# Patient Record
Sex: Female | Born: 1980 | Hispanic: Yes | Marital: Married | Smoking: Never smoker
Health system: Southern US, Community
[De-identification: ages and names within clinical notes are randomized; demographics above are authoritative.]

## PROBLEM LIST (undated history)

## (undated) ENCOUNTER — Inpatient Hospital Stay (HOSPITAL_COMMUNITY): Payer: Self-pay

## (undated) DIAGNOSIS — J189 Pneumonia, unspecified organism: Secondary | ICD-10-CM

## (undated) DIAGNOSIS — O24419 Gestational diabetes mellitus in pregnancy, unspecified control: Secondary | ICD-10-CM

## (undated) HISTORY — DX: Gestational diabetes mellitus in pregnancy, unspecified control: O24.419

## (undated) HISTORY — PX: LASIK: SHX215

---

## 2003-08-17 ENCOUNTER — Emergency Department (HOSPITAL_COMMUNITY): Admission: EM | Admit: 2003-08-17 | Discharge: 2003-08-18 | Payer: Self-pay | Admitting: Emergency Medicine

## 2003-08-21 ENCOUNTER — Encounter (INDEPENDENT_AMBULATORY_CARE_PROVIDER_SITE_OTHER): Payer: Self-pay | Admitting: Specialist

## 2003-08-21 ENCOUNTER — Inpatient Hospital Stay (HOSPITAL_COMMUNITY): Admission: AD | Admit: 2003-08-21 | Discharge: 2003-08-21 | Payer: Self-pay | Admitting: Family Medicine

## 2003-08-22 ENCOUNTER — Inpatient Hospital Stay (HOSPITAL_COMMUNITY): Admission: AD | Admit: 2003-08-22 | Discharge: 2003-08-22 | Payer: Self-pay | Admitting: Obstetrics & Gynecology

## 2003-08-29 ENCOUNTER — Inpatient Hospital Stay (HOSPITAL_COMMUNITY): Admission: AD | Admit: 2003-08-29 | Discharge: 2003-08-29 | Payer: Self-pay | Admitting: Obstetrics and Gynecology

## 2003-09-13 ENCOUNTER — Encounter: Admission: RE | Admit: 2003-09-13 | Discharge: 2003-09-13 | Payer: Self-pay | Admitting: Obstetrics and Gynecology

## 2004-11-07 ENCOUNTER — Inpatient Hospital Stay (HOSPITAL_COMMUNITY): Admission: AD | Admit: 2004-11-07 | Discharge: 2004-11-07 | Payer: Self-pay | Admitting: Obstetrics

## 2004-12-22 ENCOUNTER — Inpatient Hospital Stay (HOSPITAL_COMMUNITY): Admission: AD | Admit: 2004-12-22 | Discharge: 2004-12-22 | Payer: Self-pay | Admitting: Obstetrics

## 2004-12-24 ENCOUNTER — Encounter (INDEPENDENT_AMBULATORY_CARE_PROVIDER_SITE_OTHER): Payer: Self-pay | Admitting: *Deleted

## 2004-12-24 ENCOUNTER — Inpatient Hospital Stay (HOSPITAL_COMMUNITY): Admission: AD | Admit: 2004-12-24 | Discharge: 2004-12-27 | Payer: Self-pay | Admitting: Obstetrics

## 2009-03-24 ENCOUNTER — Encounter: Payer: Self-pay | Admitting: Emergency Medicine

## 2009-03-24 ENCOUNTER — Inpatient Hospital Stay (HOSPITAL_COMMUNITY): Admission: AD | Admit: 2009-03-24 | Discharge: 2009-03-25 | Payer: Self-pay | Admitting: Obstetrics

## 2009-05-17 ENCOUNTER — Inpatient Hospital Stay (HOSPITAL_COMMUNITY): Admission: AD | Admit: 2009-05-17 | Discharge: 2009-05-19 | Payer: Self-pay | Admitting: Obstetrics

## 2010-05-18 LAB — URINALYSIS, ROUTINE W REFLEX MICROSCOPIC
Bilirubin Urine: NEGATIVE
Hgb urine dipstick: NEGATIVE
Ketones, ur: NEGATIVE mg/dL
Nitrite: NEGATIVE
Protein, ur: NEGATIVE mg/dL
Specific Gravity, Urine: 1.017 (ref 1.005–1.030)
Urobilinogen, UA: 1 mg/dL (ref 0.0–1.0)

## 2010-05-18 LAB — BASIC METABOLIC PANEL
CO2: 19 mEq/L (ref 19–32)
Calcium: 7.8 mg/dL — ABNORMAL LOW (ref 8.4–10.5)
Chloride: 104 mEq/L (ref 96–112)
Creatinine, Ser: 0.51 mg/dL (ref 0.4–1.2)
Glucose, Bld: 100 mg/dL — ABNORMAL HIGH (ref 70–99)
Potassium: 3.5 mEq/L (ref 3.5–5.1)

## 2010-05-18 LAB — CBC
HCT: 31.4 % — ABNORMAL LOW (ref 36.0–46.0)
Hemoglobin: 10.9 g/dL — ABNORMAL LOW (ref 12.0–15.0)
RBC: 3.79 MIL/uL — ABNORMAL LOW (ref 3.87–5.11)
RDW: 14.9 % (ref 11.5–15.5)
WBC: 13.7 10*3/uL — ABNORMAL HIGH (ref 4.0–10.5)

## 2010-05-18 LAB — DIFFERENTIAL
Basophils Absolute: 0 10*3/uL (ref 0.0–0.1)
Basophils Relative: 0 % (ref 0–1)
Eosinophils Relative: 0 % (ref 0–5)
Monocytes Absolute: 1 10*3/uL (ref 0.1–1.0)
Monocytes Relative: 7 % (ref 3–12)

## 2010-05-18 LAB — URINE MICROSCOPIC-ADD ON

## 2010-05-25 LAB — CBC
MCHC: 32.3 g/dL (ref 30.0–36.0)
MCHC: 33 g/dL (ref 30.0–36.0)
Platelets: 175 10*3/uL (ref 150–400)
Platelets: 245 10*3/uL (ref 150–400)
RBC: 2.16 MIL/uL — ABNORMAL LOW (ref 3.87–5.11)
RDW: 17.3 % — ABNORMAL HIGH (ref 11.5–15.5)
WBC: 25.3 10*3/uL — ABNORMAL HIGH (ref 4.0–10.5)

## 2010-07-18 NOTE — H&P (Signed)
NAMEMarland Kitchen  TING, CAGE NO.:  [id number]   MEDICAL RECORD NO.:  [id number]          PATIENT TYPE:  INP   LOCATION:  9119                          FACILITY:  [facility]   PHYSICIAN:  Kathreen Cosier, M.D.DATE OF BIRTH:  1980-10-24   DATE OF ADMISSION:  12/24/2004  DATE OF DISCHARGE:                                HISTORY & PHYSICAL   HISTORY OF PRESENT ILLNESS:  The patient is a 30 year old gravida 2, para 1-  0-1-0, EDC December 29, 2004, negative GBS, admitted in labor at 4:30 a.m.  Cervix 4 cm, 90%, vertex -3, membranes intact.  At 9 a.m., she had an  epidural, and the membranes were ruptured artificially.  The fluid was  clear.  IUPC was inserted, and the cervix was 4 cm, 90%, vertex -3, regular  contractions.  She was started on low-dose Pitocin.  By 5 p.m., she was 6  cm, 100%, with the vertex to a -2 station; 10 mg of Pitocin, contracting  every 3 minutes.  She had an amnioinfusion of Ringers of lactate because of  some small variables that she had had.  The tracing at this time was  reasurring.  By 9 p.m., her cervix was unchanged, after her being __________  with labor.  Her temperature was 101.2.  It was decided that she would be  delivered by cesarean section for failure to progress in labor.   PHYSICAL EXAMINATION:  GENERAL:  A well-developed female in labor.  HEENT:  Negative.  LUNGS:  Clear.  HEART:  Regular rhythm.  No murmurs or gallops.  BREASTS:  No masses.  ABDOMEN:  Term size.  Estimated fetal weight 7 pounds.  PELVIC FINDINGS:  As above.  EXTREMITIES:  Negative.           ______________________________  Kathreen Cosier, M.D.     BAM/MEDQ  D:  12/24/2004  T:  12/24/2004  Job:  [id number]

## 2010-07-18 NOTE — Discharge Summary (Signed)
NAMEMarland Kitchen  HARTLEIGH, EDMONSTON NO.:  [id number]   MEDICAL RECORD NO.:  [id number]          PATIENT TYPE:  INP   LOCATION:  9119                          [facility]ACILITY[facility]  [facility]   PHYSICIAN:  Kathreen Cosier, M.D.DATE OF BIRTH:  Jul 05, 1980   DATE OF ADMISSION:  12/24/2004  DATE OF DISCHARGE:  12/27/2004                                 DISCHARGE SUMMARY   The patient is a 30 year old gravida 2, para 1-0-1-0, [facility] December 29, 2004.  Negative GBS.  She was admitted in labor and underwent primary low  transverse cesarean section.  She had a female, Apgar 9/9 weighing 6 pounds 15  from the OP position.  Patient had maternal temperature and failure to  progress as her indications for cesarean section.  Her hemoglobin was 11.4  on admission, 10 postoperative, platelets 254, 222.  RPR negative.  Patient  did well postoperative.  Her hemoglobin was 10.  She was discharged on the  third postoperative day, ambulatory, on a regular diet, to see me in six  weeks.   DISCHARGE DIAGNOSES:  Status post primary low transverse cesarean section at  term for maternal fever and failure to progress in labor.           ______________________________  Kathreen Cosier, M.D.     BAM/MEDQ  D:  01/28/2005  T:  01/28/2005  Job:  [id number]

## 2010-07-18 NOTE — Op Note (Signed)
NAMEREBBECCA, [name] NO.:  [id number]   MEDICAL RECORD NO.:  [id number]          PATIENT TYPE:  INP   LOCATION:  9119                          [facility]ILITY[facility]  [facility]   PHYSICIAN:  Kathreen Cosier, M.D.DATE OF BIRTH:  1980-05-26   DATE OF PROCEDURE:  12/24/2004  DATE OF DISCHARGE:                                 OPERATIVE REPORT   PREOPERATIVE DIAGNOSIS:  Failure to progress in labor.   SURGEON:  Kathreen Cosier, M.D.   ANESTHESIA:  Epidural.   DESCRIPTION OF PROCEDURE:  The patient was placed on the operating table in  the supine position.  Abdomen was prepped and draped.  Bladder emptied with  a Foley catheter.  Transverse suprapubic incision was made and carried down  to the rectus fascia.  The fascia was cleaned and incised the length of the  incision.  The rectus muscles were retracted laterally and the peritoneum  incised longitudinally.  A transverse incision was made in the  visceroperitoneum above the bladder and the bladder mobilized inferiorly.  A  transverse lower uterine incision was made and the fluid was clear.  The  patient delivered from the OP position of a female, Apgars 9 and 9, with a  loose nuchal cord.  The team was in attendance.  The placenta was posterior  and removed manually and sent to pathology.  The uterine cavity was cleaned  with dry laps.  The uterine incision was closed in one layer with continuous  suture of #1 chromic.  The bladder flap was reattached with 2-0 chromic.  The uterus was well contracted.  Tubes and ovaries normal.  Hemostasis  satisfactory.  Abdomen closed in layers.  The peritoneum was continuous  suture of 0 chromic, the fascia with continuous suture of 0 Dexon, and the  skin closed with subcuticular stitch of 4-0 Monocryl.           ______________________________  Kathreen Cosier, M.D.     BAM/MEDQ  D:  12/24/2004  T:  12/24/2004  Job:  [id number]

## 2011-03-03 HISTORY — PX: LASIK: SHX215

## 2014-11-01 LAB — PROCEDURE REPORT - SCANNED: Pap: NEGATIVE

## 2014-11-01 LAB — OB RESULTS CONSOLE HIV ANTIBODY (ROUTINE TESTING): HIV: NONREACTIVE

## 2014-11-01 LAB — OB RESULTS CONSOLE GC/CHLAMYDIA
CHLAMYDIA, DNA PROBE: NEGATIVE
Gonorrhea: NEGATIVE

## 2014-11-01 LAB — OB RESULTS CONSOLE HEPATITIS B SURFACE ANTIGEN: HEP B S AG: NEGATIVE

## 2014-11-01 LAB — OB RESULTS CONSOLE RUBELLA ANTIBODY, IGM: Rubella: NON-IMMUNE/NOT IMMUNE

## 2014-11-01 LAB — OB RESULTS CONSOLE ABO/RH: RH Type: POSITIVE

## 2014-11-01 LAB — OB RESULTS CONSOLE RPR: RPR: NONREACTIVE

## 2015-01-20 ENCOUNTER — Inpatient Hospital Stay (HOSPITAL_COMMUNITY)
Admission: AD | Admit: 2015-01-20 | Discharge: 2015-01-20 | Disposition: A | Discharge status: Home / Self Care | Payer: Self-pay | Source: Ambulatory Visit | Attending: Obstetrics | Admitting: Obstetrics

## 2015-01-20 ENCOUNTER — Encounter (HOSPITAL_COMMUNITY): Payer: Self-pay | Admitting: *Deleted

## 2015-01-20 DIAGNOSIS — O98812 Other maternal infectious and parasitic diseases complicating pregnancy, second trimester: Secondary | ICD-10-CM | POA: Insufficient documentation

## 2015-01-20 DIAGNOSIS — J069 Acute upper respiratory infection, unspecified: Secondary | ICD-10-CM | POA: Insufficient documentation

## 2015-01-20 DIAGNOSIS — Z3A22 22 weeks gestation of pregnancy: Secondary | ICD-10-CM | POA: Insufficient documentation

## 2015-01-20 HISTORY — DX: Pneumonia, unspecified organism: J18.9

## 2015-01-20 LAB — URINALYSIS, ROUTINE W REFLEX MICROSCOPIC
Bilirubin Urine: NEGATIVE
Glucose, UA: NEGATIVE mg/dL
Ketones, ur: NEGATIVE mg/dL
NITRITE: NEGATIVE
PROTEIN: NEGATIVE mg/dL
pH: 6 (ref 5.0–8.0)

## 2015-01-20 LAB — URINE MICROSCOPIC-ADD ON

## 2015-01-20 MED ORDER — DM-GUAIFENESIN ER 30-600 MG PO TB12: 1.0000 | ORAL_TABLET | Freq: Two times a day (BID) | ORAL | Status: DC

## 2015-01-20 MED ORDER — DM-GUAIFENESIN ER 30-600 MG PO TB12
1.0000 | ORAL_TABLET | Freq: Two times a day (BID) | ORAL | Status: DC
  Administered 2015-01-20: 1 via ORAL
  Filled 2015-01-20 (×3): qty 1

## 2015-01-20 MED ORDER — ACETAMINOPHEN-CODEINE #3 300-30 MG PO TABS: 1.0000 | ORAL_TABLET | Freq: Four times a day (QID) | ORAL | Status: DC | PRN

## 2015-01-20 NOTE — MAU Note (Addendum)
C/O generalized aching for 4 days. Started coughing 3 days ago. Thinks she had fever 2 days ago. Since then has mainly been cough. Unable to sleep.

## 2015-01-20 NOTE — Discharge Instructions (Signed)
Tos en los adultos (Cough, Adult) La tos ayuda a limpiar la garganta y los pulmones. La tos puede durar solo 2 o 3semanas (aguda) o ms de 8semanas (crnica). Las causas de la tos son [place]. Puede ser el signo de [place] enfermedad o de otro trastorno. CUIDADOS EN EL HOGAR  Est atento a cualquier cambio en la tos.  Tome los medicamentos solamente como se lo haya indicado el mdico.  Si le recetaron un antibitico, tmelo como se lo haya indicado el mdico. No deje de tomarlo aunque comience a sentirse mejor.  Hable con el mdico antes de probar un medicamento para la tos.  Beba suficiente lquido para mantener el pis (orina) claro o de color amarillo plido.  Si el aire est seco, use un vaporizador o un humidificador con vapor fro en su casa.  Mantngase alejado de las cosas que lo hacen toser en el trabajo o en su casa.  Si la tos aumenta durante la noche, haga la prueba de usar almohadas adicionales para Pharmacologistmantener la cabeza ms elevada mientras duerme.  No fume e intente no estar cerca de humo. Si necesita ayuda para dejar de fumar, consulte al mdico.  No consuma cafena.  No beba alcohol.  Descanse todo lo que sea necesario. SOLICITE AYUDA SI:  Le aparecen problemas (sntomas) nuevos.  Expectora un lquido amarillento (pus) cuando tose.  La tos no mejora despus de 2 o 3semanas, o empeora.  Los medicamentos no Lowe's Companiesle alivian la tos, y no Stage managerpuede dormir bien.  Siente un dolor que se vuelve ms intenso o que no se BJ'salivia con los medicamentos.  Tiene fiebre.  Est bajando de peso y no sabe por qu.  Tiene transpiracin nocturna. SOLICITE AYUDA DE INMEDIATO SI:  Tose y escupe sangre.  Tiene dificultad para respirar.  Los latidos cardacos son muy rpidos.   Esta informacin no tiene Theme park managercomo fin reemplazar el consejo del mdico. Asegrese de hacerle al mdico cualquier pregunta que tenga.   Document Released: 10/30/2010 Document Revised: 11/07/2014 Elsevier Interactive  Patient Education 2016 [employer] Safe Medications in Pregnancy   Acne: Benzoyl Peroxide Salicylic Acid  Backache/Headache: Tylenol: 2 regular strength every 4 hours OR              2 Extra strength every 6 hours  Colds/Coughs/Allergies: Benadryl (alcohol free) 25 mg every 6 hours as needed Breath right strips Claritin Cepacol throat lozenges Chloraseptic throat spray Cold-Eeze- up to three times per day Cough drops, alcohol free Flonase (by prescription only) Guaifenesin Mucinex Robitussin DM (plain only, alcohol free) Saline nasal spray/drops Sudafed (pseudoephedrine) & Actifed ** use only after [redacted] weeks gestation and if you do not have high blood pressure Tylenol Vicks Vaporub Zinc lozenges Zyrtec   Constipation: Colace Ducolax suppositories Fleet enema Glycerin suppositories Metamucil Milk of magnesia Miralax Senokot Smooth move tea  Diarrhea: Kaopectate Imodium A-D  *NO pepto Bismol  Hemorrhoids: Anusol Anusol HC Preparation H Tucks  Indigestion: Tums Maalox Mylanta Zantac  Pepcid  Insomnia: Benadryl (alcohol free) 25mg  every 6 hours as needed Tylenol PM Unisom, no Gelcaps  Leg Cramps: Tums MagGel  Nausea/Vomiting:  Bonine Dramamine Emetrol Ginger extract Sea bands Meclizine  Nausea medication to take during pregnancy:  Unisom (doxylamine succinate 25 mg tablets) Take one tablet daily at bedtime. If symptoms are not adequately controlled, the dose can be increased to a maximum recommended dose of two tablets daily (1/2 tablet in the morning, 1/2 tablet mid-afternoon and one at bedtime). Vitamin B6 100mg  tablets. Take  one tablet twice a day (up to 200 mg per day).  Skin Rashes: Aveeno products Benadryl cream or  every 6 hours as needed Calamine Lotion 1% cortisone cream  Yeast infection: Gyne-lotrimin 7 Monistat 7   **If taking multiple medications, please check labels to avoid duplicating the same active  ingredients **take medication as directed on the label ** Do not exceed 4000 mg of tylenol in 24 hours **Do not take medications that contain aspirin or ibuprofen

## 2015-01-20 NOTE — MAU Provider Note (Signed)
  History     CSN: [id number]  Arrival date and time: 01/20/15 1241   First Provider Initiated Contact with Patient 01/20/15 1331      Chief Complaint  Patient presents with  . Cough   HPIpt is [id number], pt of Dr. Elsie StainMarshall's, who presents for evaluation of Cough for 4 days- Pt has hx of pneumonia - pt said when she had pneumonia and had chest pain, but denies chest pain at this time- her discomfort is around her throat when she coughs. Pt denies sinus congestion, but has bilateral temporal headache.  Pt has dry cough. Pt has not taken anything for her cough. Pt denies abdominal pain, cramping, bleeding or LOF Pt has not been around other sick people. Pt denies complications with her pregnancy Pt denies fever but has had some chills  Past Medical History  Diagnosis Date  . Pneumonia     Past Surgical History  Procedure Laterality Date  . Cesarean section    . Lasik      History reviewed. No pertinent family history.  Social History  Substance Use Topics  . Smoking status: Never Smoker   . Smokeless tobacco: Never Used  . Alcohol Use: No    Allergies: No Known Allergies  Prescriptions prior to admission  Medication Sig Dispense Refill Last Dose  . Prenatal Vit-Fe Fumarate-FA (PRENATAL MULTIVITAMIN) TABS tablet Take 1 tablet by mouth daily at 12 noon.   01/19/2015 at Unknown time    Review of Systems  Constitutional: Positive for chills. Negative for fever.  Respiratory: Positive for cough. Negative for sputum production.   Gastrointestinal: Negative for heartburn, nausea, vomiting, abdominal pain, diarrhea and constipation.  Genitourinary: Negative for dysuria and urgency.  Neurological: Positive for headaches.   Physical Exam   Blood pressure 101/59, pulse 102, temperature 98.3 F (36.8 C), temperature source Oral, resp. rate 18, SpO2 98 %.  Physical Exam  Nursing note and vitals reviewed. Constitutional: She is oriented to person, place, and time.  She appears well-developed and well-nourished. No distress.  HENT:  Head: Normocephalic.  Mouth/Throat: Oropharynx is clear and moist. No oropharyngeal exudate.  No edema noted- appears slightly irritated  Eyes: Pupils are equal, round, and reactive to light.  Neck: Normal range of motion.  Cardiovascular: Normal rate, regular rhythm and normal heart sounds.   Respiratory: Effort normal and breath sounds normal. No respiratory distress. She has no wheezes. She has no rales. She exhibits no tenderness.  GI: Soft.  Musculoskeletal: Normal range of motion.  Neurological: She is alert and oriented to person, place, and time.  Skin: Skin is warm and dry.  Psychiatric: She has a normal mood and affect.    MAU Course  Procedures Mucinex DM given in MAU for cough FHR 148 with doppler Assessment and Plan  URI/cough- Rx Mucinex DM every 12 hours Tylenol #3 if needed for cough Pregnancy 2nd trimester- f/u with Dr. Gaynell FaceMArshall as scheduled- sooner if sx persist or worsen  Abir Craine 01/20/2015, 1:31 PM

## 2015-01-25 ENCOUNTER — Inpatient Hospital Stay (HOSPITAL_COMMUNITY): Payer: Medicaid Other

## 2015-01-25 ENCOUNTER — Encounter (HOSPITAL_COMMUNITY): Payer: Self-pay | Admitting: *Deleted

## 2015-01-25 ENCOUNTER — Inpatient Hospital Stay (HOSPITAL_COMMUNITY)
Admission: AD | Admit: 2015-01-25 | Discharge: 2015-01-29 | DRG: 781 | Disposition: A | Discharge status: Home / Self Care | Payer: Medicaid Other | Source: Ambulatory Visit | Attending: Obstetrics | Admitting: Obstetrics

## 2015-01-25 DIAGNOSIS — R05 Cough: Secondary | ICD-10-CM

## 2015-01-25 DIAGNOSIS — R053 Chronic cough: Secondary | ICD-10-CM

## 2015-01-25 DIAGNOSIS — Z3A01 Less than 8 weeks gestation of pregnancy: Secondary | ICD-10-CM | POA: Diagnosis not present

## 2015-01-25 DIAGNOSIS — O99512 Diseases of the respiratory system complicating pregnancy, second trimester: Principal | ICD-10-CM | POA: Diagnosis present

## 2015-01-25 DIAGNOSIS — O26892 Other specified pregnancy related conditions, second trimester: Secondary | ICD-10-CM | POA: Diagnosis not present

## 2015-01-25 DIAGNOSIS — J690 Pneumonitis due to inhalation of food and vomit: Secondary | ICD-10-CM | POA: Diagnosis present

## 2015-01-25 DIAGNOSIS — J189 Pneumonia, unspecified organism: Secondary | ICD-10-CM | POA: Diagnosis present

## 2015-01-25 LAB — CBC WITH DIFFERENTIAL/PLATELET
Basophils Absolute: 0 10*3/uL (ref 0.0–0.1)
Basophils Relative: 0 %
Eosinophils Absolute: 0.1 10*3/uL (ref 0.0–0.7)
Eosinophils Relative: 1 %
HCT: 35 % — ABNORMAL LOW (ref 36.0–46.0)
HEMOGLOBIN: 11.8 g/dL — AB (ref 12.0–15.0)
LYMPHS ABS: 1.5 10*3/uL (ref 0.7–4.0)
LYMPHS PCT: 13 %
MCH: 31.1 pg (ref 26.0–34.0)
MCHC: 33.7 g/dL (ref 30.0–36.0)
MCV: 92.1 fL (ref 78.0–100.0)
MONOS PCT: 6 %
Monocytes Absolute: 0.7 10*3/uL (ref 0.1–1.0)
NEUTROS ABS: 9.1 10*3/uL — AB (ref 1.7–7.7)
NEUTROS PCT: 80 %
PLATELETS: 235 10*3/uL (ref 150–400)
RBC: 3.8 MIL/uL — AB (ref 3.87–5.11)
RDW: 14 % (ref 11.5–15.5)
WBC: 11.4 10*3/uL — AB (ref 4.0–10.5)

## 2015-01-25 LAB — URINALYSIS, ROUTINE W REFLEX MICROSCOPIC
BILIRUBIN URINE: NEGATIVE
Glucose, UA: NEGATIVE mg/dL
Ketones, ur: NEGATIVE mg/dL
LEUKOCYTES UA: NEGATIVE
NITRITE: NEGATIVE
PH: 7 (ref 5.0–8.0)
Protein, ur: NEGATIVE mg/dL
SPECIFIC GRAVITY, URINE: 1.01 (ref 1.005–1.030)

## 2015-01-25 LAB — URINE MICROSCOPIC-ADD ON

## 2015-01-25 LAB — ABO/RH: ABO/RH(D): A POS

## 2015-01-25 LAB — TYPE AND SCREEN
ABO/RH(D): A POS
Antibody Screen: NEGATIVE

## 2015-01-25 MED ORDER — PRENATAL MULTIVITAMIN CH
1.0000 | ORAL_TABLET | Freq: Every day | ORAL | Status: DC
  Administered 2015-01-25 – 2015-01-28 (×4): 1 via ORAL
  Filled 2015-01-25 (×4): qty 1

## 2015-01-25 MED ORDER — ACETAMINOPHEN 325 MG PO TABS: 650.0000 mg | ORAL_TABLET | ORAL | Status: DC | PRN

## 2015-01-25 MED ORDER — SODIUM CHLORIDE 0.9 % IV SOLN
INTRAVENOUS | Status: DC
Start: 2015-01-25 — End: 2015-01-29
  Administered 2015-01-25 – 2015-01-28 (×10): via INTRAVENOUS

## 2015-01-25 MED ORDER — DEXTROSE 5 % IV SOLN
2.0000 g | INTRAVENOUS | Status: DC
  Administered 2015-01-25 – 2015-01-28 (×4): 2 g via INTRAVENOUS
  Filled 2015-01-25 (×5): qty 2

## 2015-01-25 MED ORDER — DOCUSATE SODIUM 100 MG PO CAPS
100.0000 mg | ORAL_CAPSULE | Freq: Every day | ORAL | Status: DC
  Administered 2015-01-26 – 2015-01-28 (×3): 100 mg via ORAL
  Filled 2015-01-25 (×3): qty 1

## 2015-01-25 MED ORDER — CALCIUM CARBONATE ANTACID 500 MG PO CHEW: 2.0000 | CHEWABLE_TABLET | ORAL | Status: DC | PRN

## 2015-01-25 MED ORDER — SODIUM CHLORIDE 0.9 % IV BOLUS (SEPSIS)
1000.0000 mL | Freq: Once | INTRAVENOUS | Status: AC
  Administered 2015-01-25: 1000 mL via INTRAVENOUS

## 2015-01-25 MED ORDER — ZOLPIDEM TARTRATE 5 MG PO TABS: 5.0000 mg | ORAL_TABLET | Freq: Every evening | ORAL | Status: DC | PRN

## 2015-01-25 MED ORDER — DEXTROSE 5 % IV SOLN
500.0000 mg | INTRAVENOUS | Status: DC
  Administered 2015-01-25: 500 mg via INTRAVENOUS
  Filled 2015-01-25: qty 500

## 2015-01-25 MED ORDER — BENZONATATE 100 MG PO CAPS
200.0000 mg | ORAL_CAPSULE | Freq: Once | ORAL | Status: AC
  Administered 2015-01-25: 200 mg via ORAL
  Filled 2015-01-25: qty 2

## 2015-01-25 NOTE — MAU Provider Note (Signed)
History     CSN: [id number]  Arrival date and time: 01/25/15 1478   First Provider Initiated Contact with Patient 01/25/15 1721      No chief complaint on file.  HPI Annette [name] 34 y.o. [id number]  presents for cough.  She is now having yellow-green phlegm.  She has shortness of breath, chills, sweats.   Last week she saw Annette [name] and was expecting to improve, but has worsened.  Mucinex,DM and Tylenol with codeine not helpful.  She states no sick contacts.   Baby is moving well.  Denies LOF, VB, dsyuria.   OB History    Gravida Para Term Preterm AB TAB SAB Ectopic Multiple Living   Past Medical History  Diagnosis Date  . Pneumonia     Past Surgical History  Procedure Laterality Date  . Cesarean section    . Lasik      History reviewed. No pertinent family history.  Social History  Substance Use Topics  . Smoking status: Never Smoker   . Smokeless tobacco: Never Used  . Alcohol Use: No    Allergies: No Known Allergies  Prescriptions prior to admission  Medication Sig Dispense Refill Last Dose  . acetaminophen-codeine (TYLENOL #3) 300-30 MG tablet Take 1-2 tablets by mouth every 6 (six) hours as needed for moderate pain. 15 tablet 0 01/24/2015 at Unknown time  . dextromethorphan-guaiFENesin (MUCINEX DM) 30-600 MG 12hr tablet Take 1 tablet by mouth 2 (two) times daily. 20 tablet 0 01/24/2015 at Unknown time  . Prenatal Vit-Fe Fumarate-FA (PRENATAL MULTIVITAMIN) TABS tablet Take 1 tablet by mouth at bedtime.    01/24/2015 at Unknown time    ROS Pertinent ROS in HPI.  All other systems are negative.   Physical Exam   Blood pressure 103/55, pulse 90, temperature 97.8 F (36.6 C), resp. rate 18, SpO2 93 %.  Physical Exam  BP 96/61 mmHg  Pulse 84  Temp(Src) 98.4 F (36.9 C) (Oral)  Resp 18  Ht 5' (1.524 m)  Wt 142 lb (64.411 kg)  BMI 27.73 kg/m2  SpO2 92%  Constitutional: Well developed, well nourished female in NAD.    Cardiovascular: Regular rate and rhythm Respiratory: clear to auscultation in upper lobes, lower lobes bilaterally with diminished lung sounds.  No crackles or consolidation appreciated Skin: Warm, dry Neuro: CN II-XII grossly intact Psych: Normal mood, normal behavior   MAU Course  Procedures  MDM Oxygen saturation at 92%. Oxygen started via nasal canula.  Oxygen sat improved to 94-96%.  CBC and CXR ordered to eval.  Results for orders placed or performed during the hospital encounter of 01/25/15 (from the past 24 hour(s))  CBC with Differential/Platelet     Status: Abnormal   Collection Time: 01/25/15  5:28 PM  Result Value Ref Range   WBC 11.4 (H) 4.0 - 10.5 K/uL   RBC 3.80 (L) 3.87 - 5.11 MIL/uL   Hemoglobin 11.8 (L) 12.0 - 15.0 g/dL   HCT 08.6 (L) 57.8 - 46.9 %   MCV 92.1 78.0 - 100.0 fL   MCH 31.1 26.0 - 34.0 pg   MCHC 33.7 30.0 - 36.0 g/dL   RDW 62.9 52.8 - 41.3 %   Platelets 235 150 - 400 K/uL   Neutrophils Relative % 80 %   Neutro Abs 9.1 (H) 1.7 - 7.7 K/uL   Lymphocytes Relative 13 %   Lymphs Abs 1.5 0.7 - 4.0 K/uL  Monocytes Relative 6 %   Monocytes Absolute 0.7 0.1 - 1.0 K/uL   Eosinophils Relative 1 %   Eosinophils Absolute 0.1 0.0 - 0.7 K/uL   Basophils Relative 0 %   Basophils Absolute 0.0 0.0 - 0.1 K/uL  Urinalysis, Routine w reflex microscopic (not [facility])     Status: Abnormal   Collection Time: 01/25/15  5:33 PM  Result Value Ref Range   Color, Urine YELLOW YELLOW   APPearance CLOUDY (A) CLEAR   Specific Gravity, Urine 1.010 1.005 - 1.030   pH 7.0 5.0 - 8.0   Glucose, UA NEGATIVE NEGATIVE mg/dL   Hgb urine dipstick TRACE (A) NEGATIVE   Bilirubin Urine NEGATIVE NEGATIVE   Ketones, ur NEGATIVE NEGATIVE mg/dL   Protein, ur NEGATIVE NEGATIVE mg/dL   Nitrite NEGATIVE NEGATIVE   Leukocytes, UA NEGATIVE NEGATIVE  Urine microscopic-add on     Status: Abnormal   Collection Time: 01/25/15  5:33 PM  Result Value Ref Range   Squamous Epithelial / LPF  6-30 (A) NONE SEEN   WBC, UA 0-5 0 - 5 WBC/hpf   RBC / HPF 0-5 0 - 5 RBC/hpf   Bacteria, UA RARE (A) NONE SEEN  Type and screen [facility]     Status: None   Collection Time: 01/25/15  7:25 PM  Result Value Ref Range   ABO/RH(D) A POS    Antibody Screen NEG    Sample Expiration 01/28/2015    Dg Chest 2 View  01/25/2015  CLINICAL DATA:  Cough since Tuesday. Shortness of breath. Left-sided chest pain radiating towards the back. Low oxygen saturation. Twenty-three weeks pregnant. Patient was shielded. EXAM: CHEST  2 VIEW COMPARISON:  03/24/2009 FINDINGS: Normal heart size and pulmonary vascularity. Slight nodular infiltration in the lung bases may indicate pneumonia or mild edema. No blunting of costophrenic angles. No pneumothorax. Mediastinal contours appear intact. IMPRESSION: Slight infiltration in the lung bases may indicate early pneumonia or edema. Electronically Signed   By: Burman NievesWilliam  [name] M.D.   On: 01/25/2015 18:23    Results and oxygen saturation presented to Dr. Clearance CootsHarper.  He is agreeable for admission of patient for IV antibiotics and oxygen therapy.   IV fluids and antibiotics ordered  Assessment and Plan  A: Admit to antepartum  Annette [name] 01/25/2015, 5:23 PM

## 2015-01-25 NOTE — MAU Note (Signed)
Pt states that she has had a cough for weeks, was evaluated on 01/20/15 and given medications and it is not helping. Cough is worse and now she has a sore throat.

## 2015-01-25 NOTE — MAU Note (Signed)
Pt presents to MAU with complaints of a cough since Tuesday.

## 2015-01-25 NOTE — H&P (Signed)
Annette [name] is a 34 y.o. female presenting for chest pain and SOB. Maternal Medical History:  Reason for admission: Presents at 23 weeks with chest pain and SOB.  Prenatal complications: no prenatal complications Prenatal Complications - Diabetes: none.    OB History    Gravida Para Term Preterm AB TAB SAB Ectopic Multiple Living   Past Medical History  Diagnosis Date  . Pneumonia    Past Surgical History  Procedure Laterality Date  . Cesarean section    . Lasik     Family History: family history is not on file. Social History:  reports that she has never smoked. She has never used smokeless tobacco. She reports that she does not drink alcohol or use illicit drugs.   Prenatal Transfer Tool  Maternal Diabetes: No Genetic Screening: Declined Maternal Ultrasounds/Referrals: Normal Fetal Ultrasounds or other Referrals:  None Maternal Substance Abuse:  No Significant Maternal Medications:  None Significant Maternal Lab Results:  None Other Comments:  None  Review of Systems  Respiratory: Positive for shortness of breath.   Cardiovascular: Positive for chest pain.  All other systems reviewed and are negative.     Blood pressure 96/61, pulse 84, temperature 98.4 F (36.9 C), temperature source Oral, resp. rate 18, height 5' (1.524 m), weight 142 lb (64.411 kg), SpO2 92 %. Maternal Exam:  Abdomen: Patient reports no abdominal tenderness.   Physical Exam  Nursing note and vitals reviewed. Constitutional: She is oriented to person, place, and time. She appears well-developed and well-nourished.  HENT:  Head: Normocephalic and atraumatic.  Eyes: Conjunctivae are normal. Pupils are equal, round, and reactive to light.  Neck: Normal range of motion. Neck supple.  Cardiovascular: Normal rate and regular rhythm.   Respiratory: Effort normal and breath sounds normal.  GI: Soft. Bowel sounds are normal.  Musculoskeletal: Normal range of motion.   Neurological: She is alert and oriented to person, place, and time.  Skin: Skin is warm and dry.  Psychiatric: She has a normal mood and affect. Her behavior is normal. Judgment and thought content normal.     Study Result     CLINICAL DATA: Cough since Tuesday. Shortness of breath. Left-sided chest pain radiating towards the back. Low oxygen saturation. Twenty-three weeks pregnant. Patient was shielded.  EXAM: CHEST 2 VIEW  COMPARISON: 03/24/2009  FINDINGS: Normal heart size and pulmonary vascularity. Slight nodular infiltration in the lung bases may indicate pneumonia or mild edema. No blunting of costophrenic angles. No pneumothorax. Mediastinal contours appear intact.  IMPRESSION: Slight infiltration in the lung bases may indicate early pneumonia or edema.   Electronically Signed  By: Burman Nieves M.D.  On: 01/25/2015 18:23        Results for AMIL, MOSEMAN (MRN [id number]) as of 01/25/2015 21:43  Ref. Range 01/25/2015 17:28  WBC Latest Ref Range: 4.0-10.5 K/uL 11.4 (H)  RBC Latest Ref Range: 3.87-5.11 MIL/uL 3.80 (L)  Hemoglobin Latest Ref Range: 12.0-15.0 g/dL 40.9 (L)  HCT Latest Ref Range: 36.0-46.0 % 35.0 (L)  MCV Latest Ref Range: 78.0-100.0 fL 92.1  MCH Latest Ref Range: 26.0-34.0 pg 31.1  MCHC Latest Ref Range: 30.0-36.0 g/dL 81.1  RDW Latest Ref Range: 11.5-15.5 % 14.0  Platelets Latest Ref Range: 150-400 K/uL 235  Neutrophils Latest Units: % 80  Lymphocytes Latest Units: % 13  Monocytes Relative Latest Units: % 6  Eosinophil Latest Units: % 1  Basophil Latest Units: % 0  NEUT# Latest Ref Range: 1.7-7.7 K/uL 9.1 (H)  Lymphocyte # Latest Ref Range: 0.7-4.0 K/uL 1.5  Monocyte # Latest Ref Range: 0.1-1.0 K/uL 0.7  Eosinophils Absolute Latest Ref Range: 0.0-0.7 K/uL 0.1  Basophils Absolute Latest Ref Range: 0.0-0.1 K/uL 0.0   Prenatal labs: ABO, Rh: --/--/A POS (11/25 1925) Antibody: NEG (11/25 1925) Rubella:   RPR:     HBsAg:    HIV:    GBS:     Assessment/Plan: 23 weeks.  Early pneumonia.  Admit.  IV antibiotics.   Kaydi Kley A 01/25/2015, 9:36 PM

## 2015-01-25 NOTE — MAU Note (Signed)
[facility]. Pt may go to [phone]

## 2015-01-26 MED ORDER — AZITHROMYCIN 250 MG PO TABS
500.0000 mg | ORAL_TABLET | ORAL | Status: DC
  Administered 2015-01-26 – 2015-01-28 (×3): 500 mg via ORAL
  Filled 2015-01-26 (×3): qty 2

## 2015-01-26 MED ORDER — INFLUENZA VAC SPLIT QUAD 0.5 ML IM SUSY: 0.5000 mL | PREFILLED_SYRINGE | INTRAMUSCULAR | Status: DC

## 2015-01-26 NOTE — Consult Note (Signed)
[facility] Medical Consultation  Annette FiddlerYolanda [name] HKV:[id number] DOB: 07/03/1980 DOA: 01/25/2015 PCP: No primary care provider on file.   Requesting physician: Dr. Coral Ceoharles Harper Date of consultation: 01/26/15 Reason for consultation: CAP  Impression/Recommendations Active Problems:   Pneumonia affecting pregnancy in second trimester - confirmed on x ray - Agree with Azithromycin and Ceftriaxone for CAP - Once leukocytosis resolved and breathing condition improved may consider transitioning to oral antibiotic regimen   Chief Complaint: Cough, DOE  HPI:  34 y/o [redacted] wks pregnant presenting with cough, sob, and DOE. She denies any hemoptysis or recent travel outside of the country. States that for the last 3 days her symptoms started and have progressively got worse. The problem has been persistent since onset as such she presented for further evaluation and recommendations. While in the hospital she had a chest x ray which was positive or suspicious for PNA.  Review of Systems:  12 point review of system reviewed and negative unless otherwise mentioned above.  Past Medical History  Diagnosis Date  . Pneumonia    Past Surgical History  Procedure Laterality Date  . Cesarean section    . Lasik     Social History:  reports that she has never smoked. She has never used smokeless tobacco. She reports that she does not drink alcohol or use illicit drugs.  No Known Allergies History reviewed. No pertinent family history.  Prior to Admission medications   Medication Sig Start Date End Date Taking? Authorizing Provider  acetaminophen-codeine (TYLENOL #3) 300-30 MG tablet Take 1-2 tablets by mouth every 6 (six) hours as needed for moderate pain. 01/20/15  Yes Jean RosenthalSusan P Lineberry, NP  dextromethorphan-guaiFENesin (MUCINEX DM) 30-600 MG 12hr tablet Take 1 tablet by mouth 2 (two) times daily. 01/20/15  Yes Jean RosenthalSusan P Lineberry, NP  Prenatal Vit-Fe Fumarate-FA (PRENATAL MULTIVITAMIN)  TABS tablet Take 1 tablet by mouth at bedtime.    Yes Historical Provider, MD   Physical Exam: Blood pressure 93/60, pulse 84, temperature 98.2 F (36.8 C), temperature source Oral, resp. rate 18, height 5' (1.524 m), weight 64.411 kg (142 lb), SpO2 93 %. Filed Vitals:   01/26/15 1553 01/26/15 1557  BP:    Pulse: 79 84  Temp:    Resp:       General:  Pt in nad, alert and awake  Eyes: EOMI  ENT: no masses on visual examination  Neck: supple, no goiter  Cardiovascular: rrr, no mrg  Respiratory: equal chest rise, breahting comfortably with [place] in place, no wheezes, mild rhales at bases  Abdomen: soft, no guarding  Skin: no cyanosis  Musculoskeletal: equal tone BL  Psychiatric: mood and affect appropriate  Neurologic: answers questions appropriately, moves extremities equally.  Labs on Admission:  Basic Metabolic Panel: No results for input(s): NA, K, CL, CO2, GLUCOSE, BUN, CREATININE, CALCIUM, MG, PHOS in the last 168 hours. Liver Function Tests: No results for input(s): AST, ALT, ALKPHOS, BILITOT, PROT, ALBUMIN in the last 168 hours. No results for input(s): LIPASE, AMYLASE in the last 168 hours. No results for input(s): AMMONIA in the last 168 hours. CBC:  Recent Labs Lab 01/25/15 1728  WBC 11.4*  NEUTROABS 9.1*  HGB 11.8*  HCT 35.0*  MCV 92.1  PLT 235   Cardiac Enzymes: No results for input(s): CKTOTAL, CKMB, CKMBINDEX, TROPONINI in the last 168 hours. BNP: Invalid input(s): POCBNP CBG: No results for input(s): GLUCAP in the last 168 hours.  Radiological Exams on Admission: Dg Chest 2 View  01/25/2015  CLINICAL  DATA:  Cough since Tuesday. Shortness of breath. Left-sided chest pain radiating towards the back. Low oxygen saturation. Twenty-three weeks pregnant. Patient was shielded. EXAM: CHEST  2 VIEW COMPARISON:  03/24/2009 FINDINGS: Normal heart size and pulmonary vascularity. Slight nodular infiltration in the lung bases may indicate pneumonia or mild  edema. No blunting of costophrenic angles. No pneumothorax. Mediastinal contours appear intact. IMPRESSION: Slight infiltration in the lung bases may indicate early pneumonia or edema. Electronically Signed   By: Burman Nieves M.D.   On: 01/25/2015 18:23    Time spent: > 35 minutes  Penny Pia [facility] Pager [phone]  If 7PM-7AM, please contact night-coverage [email or web address] Password [email or web address] 01/26/2015, 4:02 PM

## 2015-01-26 NOTE — Progress Notes (Signed)
Patient ID: Melina FiddlerYolanda [name], female   DOB: 07/30/1980, 34 y.o.   MRN: [id number] Hospital Day: 2  S: Breathing without labor.  O: Blood pressure 89/52, pulse 81, temperature 98.3 F (36.8 C), temperature source Oral, resp. rate 22, height 5' (1.524 m), weight 142 lb (64.411 kg), SpO2 93 %.   WUJ:WJXBJYNWFHT:Baseline: 140's bpm Toco: None SVE:   A/P- 34 y.o. admitted with: Chest pain and SOB.  CXR showed lower lobe pneumonia.  Admitted for antibiotic therapy and respiratory management.  Stable.  Continue Rocephin / Azithromycin.  Present on Admission:  . Pneumonia affecting pregnancy in second trimester  Pregnancy Complications: none  Preterm labor management: no treatment necessary Dating:  7619w3d PNL Needed:  none FWB:  good PTL:  stable

## 2015-01-26 NOTE — Progress Notes (Signed)
While checking orders for the night found an order for "R.T. Consult to Respiratory Care treatment". RT had not been called for this order. Called antenatal to speak with nurse.  Johna RolesJ. Barnes, RN and I discussed patient to find out what the order was for. At this time patient is sleeping and RT vitals within normal limits. Arranged to consult with patient at 8am by RT.

## 2015-01-26 NOTE — Progress Notes (Signed)
Respiratory Care consult for pt. with pneumonia.  I assessed this patient with Spanish speaking interpreter at bedside. Pt. does not smoke. Pt. is able to take deep breaths. Currently on 4LPM O2 sats 94-96 ,RR 18.  Pt. does not appear to have labored breathing. CXR reviewed.  BBS equal with rales to bases bilat, no wheezing noted. I would recommend Incentive Spirometry Q1 hour while awake and Q4prn 2.5mg  Albuterol HHN for wheezing. I discussed recommendations with pt.s RN.

## 2015-01-27 DIAGNOSIS — O99512 Diseases of the respiratory system complicating pregnancy, second trimester: Principal | ICD-10-CM

## 2015-01-27 DIAGNOSIS — J189 Pneumonia, unspecified organism: Secondary | ICD-10-CM

## 2015-01-27 LAB — CBC
HEMATOCRIT: 34.4 % — AB (ref 36.0–46.0)
Hemoglobin: 11.3 g/dL — ABNORMAL LOW (ref 12.0–15.0)
MCH: 30.7 pg (ref 26.0–34.0)
MCHC: 32.8 g/dL (ref 30.0–36.0)
MCV: 93.5 fL (ref 78.0–100.0)
Platelets: 245 10*3/uL (ref 150–400)
RBC: 3.68 MIL/uL — ABNORMAL LOW (ref 3.87–5.11)
RDW: 14.4 % (ref 11.5–15.5)
WBC: 10.1 10*3/uL (ref 4.0–10.5)

## 2015-01-27 NOTE — Progress Notes (Signed)
Assisted Laboratory with interpretation of procedure with baby patient.  Spanish Interprerter

## 2015-01-27 NOTE — Progress Notes (Signed)
Assisted RN and Lab with interpretation of procedure.  Spanish interpreter

## 2015-01-27 NOTE — Progress Notes (Signed)
Checked on patients needs. Assisted RN with interpretation of patient assessment. °Spanish Interpreter  °

## 2015-01-27 NOTE — Progress Notes (Addendum)
No fevers, normal WBC count On discharge she may continue abx for total of 7 days, azithromycin and rocpehin in hospital and on discharge Augmentin (category B)  for left over amount.  Najah Liverman [facility] PasseypitalRH  [phone]

## 2015-01-27 NOTE — Progress Notes (Signed)
Patient ID: Annette [name], female   DOB: 05/05/1980, 34 y.o.   MRN: [id number] Hospital Day: 3  S: No complaints.  O: Blood pressure 85/48, pulse 86, temperature 98.5 F (36.9 C), temperature source Oral, resp. rate 20, height 5' (1.524 m), weight 142 lb (64.411 kg), SpO2 96 %.   ZHY:QMVHQIONFHT:Baseline: 140 bpm Toco: None SVE:   A/P- 34 y.o. admitted with:  Labored breathing and dyspnea on exertion.  CXR c/w early pneumonia.  Admitted and started on antibiotics and respiratory support.  Stable.  Continue Azithromycin / Rocephin.  Present on Admission:  . Pneumonia affecting pregnancy in second trimester  Pregnancy Complications: Pnuemonia  Preterm labor management: no treatment necessary Dating:  2920w4d PNL Needed:  none FWB:  good PTL:  none

## 2015-01-27 NOTE — Progress Notes (Signed)
Checked on patients needs.  °Spanish Interpreter  °

## 2015-01-27 NOTE — Progress Notes (Signed)
Checked on patients needs and ordered patients dinner.  Spanish Interpreter

## 2015-01-28 ENCOUNTER — Inpatient Hospital Stay (HOSPITAL_COMMUNITY): Payer: Medicaid Other

## 2015-01-28 LAB — CBC
HEMATOCRIT: 34.7 % — AB (ref 36.0–46.0)
HEMOGLOBIN: 11.5 g/dL — AB (ref 12.0–15.0)
MCH: 31.7 pg (ref 26.0–34.0)
MCHC: 33.1 g/dL (ref 30.0–36.0)
MCV: 95.6 fL (ref 78.0–100.0)
Platelets: 256 10*3/uL (ref 150–400)
RBC: 3.63 MIL/uL — ABNORMAL LOW (ref 3.87–5.11)
RDW: 14.3 % (ref 11.5–15.5)
WBC: 10.7 10*3/uL — AB (ref 4.0–10.5)

## 2015-01-28 MED ORDER — SALINE SPRAY 0.65 % NA SOLN
1.0000 | NASAL | Status: DC | PRN
  Filled 2015-01-28 (×2): qty 44

## 2015-01-28 MED ORDER — AMOXICILLIN-POT CLAVULANATE 875-125 MG PO TABS
1.0000 | ORAL_TABLET | Freq: Two times a day (BID) | ORAL | Status: DC
  Administered 2015-01-28 – 2015-01-29 (×3): 1 via ORAL
  Filled 2015-01-28 (×3): qty 1

## 2015-01-28 NOTE — Progress Notes (Signed)
Patient ID: Annette FiddlerYolanda [name], female   DOB: 07/30/1980, 34 y.o.   MRN: [id number] Afebrile 0294 On Zithromax and Rocephin 2 g every 24 hours Her lungs are clear She feels much better this is day 4 will repeat her CBC and chest x-ray today

## 2015-01-29 NOTE — Progress Notes (Signed)
Patient ID: Melina FiddlerYolanda Garcia-Ramirez, female   DOB: 04/13/1980, 34 y.o.   MRN: [id number] Blood pressure 100/55 respiration 18 pulse 60 remains afebrile patient has not had fever since she's been admitted repeat chest x-ray yesterday suggested pneumonia possibly aspiration infectious disease was consulted and they suggested starting her on Augmentin 875 twice a day and continuing her Zithromax her lungs are clear and she feels better she would discharge today to see me in 2 weeks her white count was 10.7 which was not a significant change from admission

## 2015-01-29 NOTE — Progress Notes (Signed)
No fevers, normal WBC count Her CXR with ?aspiration pneumonia Recommend ID consult TRH will sign off Thank you  Manson Passeylma Niels Cranshaw [facility]  [phone]

## 2015-01-29 NOTE — Discharge Summary (Signed)
  Patient is a 34 year old gravida 4 para 2012 EDC 320 20 1723 weeks and 6 days was admitted because of chest pain and shortness of breath and the chest x-ray showed bilateral lower you know lobe pneumonia repeat x-ray yesterday brought up the possibility of an aspiration and and infectious disease was consulted and we are and she was on Zithromax and Rocephin they suggested adding Augmentin 875 by mouth twice a day the patient remains afebrile and feels better she'll be discharged home today on Augmentin 875 twice a day and Zithromax 500 by mouth daily for the next 10 days to see me in 2 weeks

## 2015-01-29 NOTE — Discharge Instructions (Signed)
Discharge instructions   You can wash your hair  Shower  Eat what you want  Drink what you want   MARSHALL,BERNARD A, MD 01/29/2015     Neumona extrahospitalaria en los adultos (Community-Acquired Pneumonia, Adult) La neumona es una infeccin en los pulmones. La neumona puede contagiarse mientras una persona est hospitalizada. Cuando una persona no est en el hospital, puede tener un tipo diferente de la enfermedad (neumona extrahospitalaria), la cual se transmite fcilmente de Neomia Dearuna persona a Educational psychologistotra. Una persona puede contraer neumona extrahospitalaria si respira cerca de un individuo infectado que tose o estornuda. Algunos sntomas incluyen lo siguiente:  Tos seca.  Tos con expectoracin (productiva).  Grant RutsFiebre.  Sudoracin.  Dolor en el pecho.  CUIDADOS EN EL HOGAR  Tome los medicamentos de venta libre y los recetados solamente como se lo haya indicado el mdico.  Tome los medicamentos para la tos solamente si no puede dormir bien.  Si le recetaron un antibitico, tmelo como se lo haya indicado el mdico. No deje de tomar los antibiticos aunque comience a Actorsentirse mejor.  Duerma con la cabeza y el cuello elevados. Para lograrlo, colquese algunas almohadas debajo de la cabeza, o bien puede dormir en un silln reclinable.  No consuma productos que contengan tabaco. Estos incluyen cigarrillos, tabaco para mascar y Administrator, Civil Servicecigarrillos electrnicos. Si necesita ayuda para dejar de fumar, consulte al mdico.  Beba suficiente agua para mantener la orina clara o de color amarillo plido. Una vacuna puede ayudar a prevenir la neumona. Habitualmente, la vacuna se recomienda a:  Las Smith Internationalpersonas mayores de [id number].  Las Smith Internationalpersonas mayores de 19aos:  Las personas que estn recibiendo tratamiento para Management consultantel cncer.  Las personas que tienen una enfermedad pulmonar a largo plazo (crnica).  Las Eli Lilly and Companypersonas que tienen problemas del sistema de defensa del organismo (sistema  inmunitario). Tambin puede evitar la neumona si toma las siguientes medidas:  Se aplica la vacuna antigripal todos los Carlisleaos.  Visita al dentista con la frecuencia indicada.  Se lava las manos a menudo. Utilice un desinfectante para manos si no dispone de [place] y [place]jabn. SOLICITE AYUDA SI:  Tiene fiebre.  No puede dormir bien porque el medicamento para la tos no resulta eficaz. SOLICITE AYUDA DE INMEDIATO SI:  Le falta el aire y este sntoma Mexicoempeora.  Aumenta el dolor en el pecho.  La enfermedad empeora. Esto es muy grave si:  Es un adulto mayor.  El sistema de defensa del organismo est debilitado.  Tose y escupe sangre.   Esta informacin no tiene Theme park managercomo fin reemplazar el consejo del mdico. Asegrese de hacerle al mdico cualquier pregunta que tenga.   Document Released: 08/06/2009 Document Revised: 11/07/2014 Elsevier Interactive Patient Education [employer]2016 Elsevier Inc.

## 2015-02-15 ENCOUNTER — Other Ambulatory Visit (HOSPITAL_COMMUNITY): Payer: Self-pay | Admitting: Obstetrics

## 2015-02-15 ENCOUNTER — Ambulatory Visit (HOSPITAL_COMMUNITY)
Admission: RE | Admit: 2015-02-15 | Discharge: 2015-02-15 | Disposition: A | Discharge status: Home / Self Care | Payer: Self-pay | Source: Ambulatory Visit | Attending: Obstetrics | Admitting: Obstetrics

## 2015-02-15 DIAGNOSIS — J189 Pneumonia, unspecified organism: Secondary | ICD-10-CM | POA: Insufficient documentation

## 2015-03-03 NOTE — L&D Delivery Note (Signed)
Delivery Note At 12:53 AM a viable female was delivered via  (Presentation: ;  ).  APGAR: , ; weight  .   Placenta status: Intact, Pathology Manual removal.  Cord:  with the following complications: .  Cord pH: not done  Anesthesia: Epidural  Episiotomy:   Lacerations:   Suture Repair: 2.0 Est. Blood Loss (mL):    Mom to postpartum.  Baby to Couplet care / Skin to Skin.  MARSHALL,BERNARD A 05/12/2015, 1:02 AM

## 2015-04-11 LAB — OB RESULTS CONSOLE GBS: STREP GROUP B AG: NEGATIVE

## 2015-05-11 ENCOUNTER — Inpatient Hospital Stay (HOSPITAL_COMMUNITY)
Admission: AD | Admit: 2015-05-11 | Discharge: 2015-05-13 | DRG: 767 | Disposition: A | Discharge status: Home / Self Care | Payer: Medicaid Other | Source: Ambulatory Visit | Attending: Obstetrics | Admitting: Obstetrics

## 2015-05-11 ENCOUNTER — Encounter (HOSPITAL_COMMUNITY): Payer: Self-pay | Admitting: *Deleted

## 2015-05-11 ENCOUNTER — Inpatient Hospital Stay (HOSPITAL_COMMUNITY): Payer: Medicaid Other | Admitting: Anesthesiology

## 2015-05-11 DIAGNOSIS — IMO0001 Reserved for inherently not codable concepts without codable children: Secondary | ICD-10-CM

## 2015-05-11 DIAGNOSIS — O99214 Obesity complicating childbirth: Principal | ICD-10-CM | POA: Diagnosis present

## 2015-05-11 DIAGNOSIS — E669 Obesity, unspecified: Secondary | ICD-10-CM | POA: Diagnosis present

## 2015-05-11 DIAGNOSIS — Z6831 Body mass index (BMI) 31.0-31.9, adult: Secondary | ICD-10-CM | POA: Diagnosis not present

## 2015-05-11 DIAGNOSIS — Z3A39 39 weeks gestation of pregnancy: Secondary | ICD-10-CM | POA: Diagnosis not present

## 2015-05-11 LAB — CBC
HEMATOCRIT: 41.8 % (ref 36.0–46.0)
Hemoglobin: 14.3 g/dL (ref 12.0–15.0)
MCH: 31 pg (ref 26.0–34.0)
MCHC: 34.2 g/dL (ref 30.0–36.0)
MCV: 90.7 fL (ref 78.0–100.0)
Platelets: 201 10*3/uL (ref 150–400)
RBC: 4.61 MIL/uL (ref 3.87–5.11)
RDW: 15 % (ref 11.5–15.5)
WBC: 18.3 10*3/uL — AB (ref 4.0–10.5)

## 2015-05-11 LAB — TYPE AND SCREEN
ABO/RH(D): A POS
Antibody Screen: NEGATIVE

## 2015-05-11 MED ORDER — OXYTOCIN BOLUS FROM INFUSION
500.0000 mL | INTRAVENOUS | Status: DC
  Administered 2015-05-12: 500 mL via INTRAVENOUS

## 2015-05-11 MED ORDER — LIDOCAINE HCL (PF) 1 % IJ SOLN
INTRAMUSCULAR | Status: DC | PRN
  Administered 2015-05-11: 3 mL via EPIDURAL
  Administered 2015-05-11: 4 mL via EPIDURAL

## 2015-05-11 MED ORDER — LACTATED RINGERS IV SOLN
INTRAVENOUS | Status: DC
  Administered 2015-05-11: 15:00:00 via INTRAVENOUS

## 2015-05-11 MED ORDER — PHENYLEPHRINE 40 MCG/ML (10ML) SYRINGE FOR IV PUSH (FOR BLOOD PRESSURE SUPPORT)
80.0000 ug | PREFILLED_SYRINGE | INTRAVENOUS | Status: DC | PRN
  Filled 2015-05-11: qty 2

## 2015-05-11 MED ORDER — FLEET ENEMA 7-19 GM/118ML RE ENEM: 1.0000 | ENEMA | RECTAL | Status: DC | PRN

## 2015-05-11 MED ORDER — ACETAMINOPHEN 325 MG PO TABS: 650.0000 mg | ORAL_TABLET | ORAL | Status: DC | PRN

## 2015-05-11 MED ORDER — FENTANYL 2.5 MCG/ML BUPIVACAINE 1/10 % EPIDURAL INFUSION (WH - ANES)
14.0000 mL/h | INTRAMUSCULAR | Status: DC | PRN
Start: 2015-05-11 — End: 2015-05-12
  Administered 2015-05-11: 12 mL/h via EPIDURAL
  Filled 2015-05-11: qty 125

## 2015-05-11 MED ORDER — LACTATED RINGERS IV SOLN
500.0000 mL | INTRAVENOUS | Status: DC | PRN
  Administered 2015-05-11: 500 mL via INTRAVENOUS
  Administered 2015-05-11: 200 mL via INTRAVENOUS

## 2015-05-11 MED ORDER — OXYCODONE-ACETAMINOPHEN 5-325 MG PO TABS: 1.0000 | ORAL_TABLET | ORAL | Status: DC | PRN

## 2015-05-11 MED ORDER — OXYTOCIN 10 UNIT/ML IJ SOLN
1.0000 m[IU]/min | INTRAVENOUS | Status: DC
  Administered 2015-05-11: 2 m[IU]/min via INTRAVENOUS

## 2015-05-11 MED ORDER — CITRIC ACID-SODIUM CITRATE 334-500 MG/5ML PO SOLN: 30.0000 mL | ORAL | Status: DC | PRN

## 2015-05-11 MED ORDER — EPHEDRINE 5 MG/ML INJ
10.0000 mg | INTRAVENOUS | Status: DC | PRN
  Filled 2015-05-11: qty 2

## 2015-05-11 MED ORDER — BUTORPHANOL TARTRATE 1 MG/ML IJ SOLN
1.0000 mg | INTRAMUSCULAR | Status: DC | PRN
Start: 2015-05-11 — End: 2015-05-12

## 2015-05-11 MED ORDER — ONDANSETRON HCL 4 MG/2ML IJ SOLN: 4.0000 mg | Freq: Four times a day (QID) | INTRAMUSCULAR | Status: DC | PRN

## 2015-05-11 MED ORDER — PHENYLEPHRINE 40 MCG/ML (10ML) SYRINGE FOR IV PUSH (FOR BLOOD PRESSURE SUPPORT)
80.0000 ug | PREFILLED_SYRINGE | INTRAVENOUS | Status: DC | PRN
Start: 2015-05-11 — End: 2015-05-12
  Filled 2015-05-11: qty 2
  Filled 2015-05-11: qty 20

## 2015-05-11 MED ORDER — LACTATED RINGERS IV SOLN: 500.0000 mL | Freq: Once | INTRAVENOUS | Status: DC

## 2015-05-11 MED ORDER — TERBUTALINE SULFATE 1 MG/ML IJ SOLN
0.2500 mg | Freq: Once | INTRAMUSCULAR | Status: DC | PRN
  Filled 2015-05-11: qty 1

## 2015-05-11 MED ORDER — LIDOCAINE HCL (PF) 1 % IJ SOLN
30.0000 mL | INTRAMUSCULAR | Status: DC | PRN
  Filled 2015-05-11: qty 30

## 2015-05-11 MED ORDER — DIPHENHYDRAMINE HCL 50 MG/ML IJ SOLN: 12.5000 mg | INTRAMUSCULAR | Status: DC | PRN

## 2015-05-11 MED ORDER — OXYTOCIN 10 UNIT/ML IJ SOLN
2.5000 [IU]/h | INTRAVENOUS | Status: DC
  Filled 2015-05-11: qty 10

## 2015-05-11 MED ORDER — OXYCODONE-ACETAMINOPHEN 5-325 MG PO TABS
2.0000 | ORAL_TABLET | ORAL | Status: DC | PRN
Start: 2015-05-11 — End: 2015-05-12

## 2015-05-11 NOTE — Progress Notes (Signed)
Assisted RN and MD for procedure with interpretations of medical questions.  Spanish Interpreter

## 2015-05-11 NOTE — Anesthesia Procedure Notes (Signed)
Epidural Patient location during procedure: OB Start time: 05/11/2015 4:05 PM  Staffing Anesthesiologist: Mal AmabileFOSTER, Shelaine Frie Performed by: anesthesiologist   Preanesthetic Checklist Completed: patient identified, site marked, surgical consent, pre-op evaluation, timeout performed, IV checked, risks and benefits discussed and monitors and equipment checked  Epidural Patient position: sitting Prep: site prepped and draped and DuraPrep Patient monitoring: continuous pulse ox and blood pressure Approach: midline Location: L3-L4 Injection technique: LOR air  Needle:  Needle type: Tuohy  Needle gauge: 17 G Needle length: 9 cm and 9 Needle insertion depth: 5 cm cm Catheter type: closed end flexible Catheter size: 19 Gauge Catheter at skin depth: 10 cm Test dose: negative  Assessment Events: blood not aspirated, injection not painful, no injection resistance, negative IV test and no paresthesia  Additional Notes Patient identified. Risks and benefits discussed including failed block, incomplete  Pain control, post dural puncture headache, nerve damage, paralysis, blood pressure Changes, nausea, vomiting, reactions to medications-both toxic and allergic and post Partum back pain. All questions were answered. Patient expressed understanding and wished to proceed. Sterile technique was used throughout procedure. Epidural site was Dressed with sterile barrier dressing. No paresthesias, signs of intravascular injection Or signs of intrathecal spread were encountered.  Patient was more comfortable after the epidural was dosed. Please see RN's note for documentation of vital signs and FHR which are stable.

## 2015-05-11 NOTE — MAU Note (Signed)
Pt presents to MAU with complaints of contractions with bloody show. Denies any ROM

## 2015-05-11 NOTE — Progress Notes (Signed)
Checked on patients needs.  °Spanish Interpreter  °

## 2015-05-11 NOTE — Progress Notes (Signed)
Dr. Gaynell FaceMarshall notified of pt arrival to MAU.  Notified that pt is a G4P2 at 2477w3d.  Notified that pt was not having acceleration when first put on the monitor but she was given apple juice and turned on her side and started having accelerations.  Notified that pt is 3cm, 90%, -3, and vertex.  Provider states to walk pt for one hour and recheck her.

## 2015-05-11 NOTE — H&P (Signed)
This is Dr. Francoise CeoBernard [name] dictating the history and physical on  Annette [name]  she is a 35 year old gravida 4 para 2012 EDC 05/22/2015 she's 38 weeks and 3 days negative GBS she's in labor cervix 5 cm 90% -2-3 amniotomy performed and the fluid color blood-tinged Past medical history negative Past surgical history negative Social history negative System review negative Physical exam well-developed female in labor HEENT negative Lungs clear to P&A Heart regular rhythm no murmurs no gallops Breasts negative Abdomen term Pelvic as described above Extremities negative

## 2015-05-11 NOTE — Progress Notes (Signed)
Updated Dr. Gaynell FaceMarshall that pt is 4 cm, 90%, -3, and vertex. Provider states to admit pt to Labor and Delivery and have labor and delivery call him once she is admitted.

## 2015-05-11 NOTE — Anesthesia Preprocedure Evaluation (Signed)
Anesthesia Evaluation  Patient identified by MRN, date of birth, ID band Patient awake    Reviewed: Allergy & Precautions, Patient's Chart, lab work & pertinent test results  Airway Mallampati: III  TM Distance: >3 FB Neck ROM: Full    Dental no notable dental hx. (+) Teeth Intact   Pulmonary pneumonia, resolved,    Pulmonary exam normal breath sounds clear to auscultation       Cardiovascular negative cardio ROS Normal cardiovascular exam Rhythm:Regular Rate:Normal     Neuro/Psych negative neurological ROS  negative psych ROS   GI/Hepatic negative GI ROS, Neg liver ROS,   Endo/Other  Obesity  Renal/GU negative Renal ROS  negative genitourinary   Musculoskeletal negative musculoskeletal ROS (+)   Abdominal (+) + obese,   Peds  Hematology negative hematology ROS (+)   Anesthesia Other Findings   Reproductive/Obstetrics (+) Pregnancy Previous C/Section                             Anesthesia Physical Anesthesia Plan  ASA: II  Anesthesia Plan: Epidural   Post-op Pain Management:    Induction:   Airway Management Planned: Natural Airway  Additional Equipment:   Intra-op Plan:   Post-operative Plan:   Informed Consent: I have reviewed the patients History and Physical, chart, labs and discussed the procedure including the risks, benefits and alternatives for the proposed anesthesia with the patient or authorized representative who has indicated his/her understanding and acceptance.     Plan Discussed with: Anesthesiologist  Anesthesia Plan Comments:         Anesthesia Quick Evaluation

## 2015-05-12 ENCOUNTER — Encounter (HOSPITAL_COMMUNITY): Payer: Self-pay | Admitting: *Deleted

## 2015-05-12 LAB — CBC
HCT: 38.2 % (ref 36.0–46.0)
Hemoglobin: 13 g/dL (ref 12.0–15.0)
MCH: 31 pg (ref 26.0–34.0)
MCHC: 34 g/dL (ref 30.0–36.0)
MCV: 91.2 fL (ref 78.0–100.0)
PLATELETS: 176 10*3/uL (ref 150–400)
RBC: 4.19 MIL/uL (ref 3.87–5.11)
RDW: 15.1 % (ref 11.5–15.5)
WBC: 21.1 10*3/uL — AB (ref 4.0–10.5)

## 2015-05-12 LAB — RPR: RPR Ser Ql: NONREACTIVE

## 2015-05-12 MED ORDER — PRENATAL MULTIVITAMIN CH
1.0000 | ORAL_TABLET | Freq: Every day | ORAL | Status: DC
  Administered 2015-05-12: 1 via ORAL
  Filled 2015-05-12: qty 1

## 2015-05-12 MED ORDER — ZOLPIDEM TARTRATE 5 MG PO TABS: 5.0000 mg | ORAL_TABLET | Freq: Every evening | ORAL | Status: DC | PRN

## 2015-05-12 MED ORDER — OXYCODONE-ACETAMINOPHEN 5-325 MG PO TABS
1.0000 | ORAL_TABLET | ORAL | Status: DC | PRN
  Administered 2015-05-12 – 2015-05-13 (×2): 1 via ORAL
  Filled 2015-05-12 (×2): qty 1

## 2015-05-12 MED ORDER — DIPHENHYDRAMINE HCL 25 MG PO CAPS: 25.0000 mg | ORAL_CAPSULE | Freq: Four times a day (QID) | ORAL | Status: DC | PRN

## 2015-05-12 MED ORDER — SIMETHICONE 80 MG PO CHEW: 80.0000 mg | CHEWABLE_TABLET | ORAL | Status: DC | PRN

## 2015-05-12 MED ORDER — DIBUCAINE 1 % RE OINT: 1.0000 "application " | TOPICAL_OINTMENT | RECTAL | Status: DC | PRN

## 2015-05-12 MED ORDER — WITCH HAZEL-GLYCERIN EX PADS: 1.0000 "application " | MEDICATED_PAD | CUTANEOUS | Status: DC | PRN

## 2015-05-12 MED ORDER — IBUPROFEN 600 MG PO TABS
600.0000 mg | ORAL_TABLET | Freq: Four times a day (QID) | ORAL | Status: DC
  Administered 2015-05-12 – 2015-05-13 (×5): 600 mg via ORAL
  Filled 2015-05-12 (×5): qty 1

## 2015-05-12 MED ORDER — BENZOCAINE-MENTHOL 20-0.5 % EX AERO
1.0000 "application " | INHALATION_SPRAY | CUTANEOUS | Status: DC | PRN
  Administered 2015-05-12: 1 via TOPICAL
  Filled 2015-05-12: qty 56

## 2015-05-12 MED ORDER — ACETAMINOPHEN 325 MG PO TABS: 650.0000 mg | ORAL_TABLET | ORAL | Status: DC | PRN

## 2015-05-12 MED ORDER — ONDANSETRON HCL 4 MG PO TABS: 4.0000 mg | ORAL_TABLET | ORAL | Status: DC | PRN

## 2015-05-12 MED ORDER — LANOLIN HYDROUS EX OINT: TOPICAL_OINTMENT | CUTANEOUS | Status: DC | PRN

## 2015-05-12 MED ORDER — TETANUS-DIPHTH-ACELL PERTUSSIS 5-2.5-18.5 LF-MCG/0.5 IM SUSP: 0.5000 mL | Freq: Once | INTRAMUSCULAR | Status: DC

## 2015-05-12 MED ORDER — SENNOSIDES-DOCUSATE SODIUM 8.6-50 MG PO TABS
2.0000 | ORAL_TABLET | ORAL | Status: DC
  Administered 2015-05-12: 2 via ORAL
  Filled 2015-05-12: qty 2

## 2015-05-12 MED ORDER — ONDANSETRON HCL 4 MG/2ML IJ SOLN: 4.0000 mg | INTRAMUSCULAR | Status: DC | PRN

## 2015-05-12 MED ORDER — FERROUS SULFATE 325 (65 FE) MG PO TABS
325.0000 mg | ORAL_TABLET | Freq: Two times a day (BID) | ORAL | Status: DC
  Administered 2015-05-12 – 2015-05-13 (×3): 325 mg via ORAL
  Filled 2015-05-12 (×4): qty 1

## 2015-05-12 NOTE — Progress Notes (Signed)
Assisted RN with lactation with breastfeeding instructions.  Spanish Interpreter

## 2015-05-12 NOTE — Progress Notes (Signed)
Patient ID: Annette [name], female   DOB: 03/29/1980, 35 y.o.   MRN: [id number] Postpartum day 0 blood pressure 128 2 at 72 pulse 77 respiration 18 Fundus firm Lochia moderate Legs negative doing well

## 2015-05-12 NOTE — Progress Notes (Signed)
Assisted Lactation RN with interpretation of breastfeeding instructions. ] °Spanish Interpreter  °

## 2015-05-12 NOTE — Progress Notes (Signed)
Assisted RN with interpretation of medical questions.  °Spanish Interpreter  °

## 2015-05-12 NOTE — Progress Notes (Signed)
Assisted RN with interpretation of medical questions.  Spanish Interpreter

## 2015-05-12 NOTE — Anesthesia Postprocedure Evaluation (Signed)
Anesthesia Post Note  Patient: Annette FiddlerYolanda [name]  Procedure(s) Performed: * No procedures listed *  Patient location during evaluation: Mother Baby Anesthesia Type: Epidural Level of consciousness: awake, awake and alert and oriented Pain management: pain level controlled Vital Signs Assessment: post-procedure vital signs reviewed and stable Respiratory status: spontaneous breathing Cardiovascular status: stable Postop Assessment: no headache, no backache, no signs of nausea or vomiting and adequate PO intake Anesthetic complications: no    Last Vitals:  Filed Vitals:   05/12/15 0453 05/12/15 0858  BP: 128/72 108/62  Pulse: 77 82  Temp: 36.7 C 37 C  Resp: 18 16    Last Pain:  Filed Vitals:   05/12/15 0859  PainSc: 0-No pain                 Salvatore Poe Hristova

## 2015-05-12 NOTE — Lactation Note (Addendum)
This note was copied from a baby's chart. Lactation Consultation Note  Patient Name: Girl Melina FiddlerYolanda Garcia-Ramirez UUVOZ'DToday's Date: 05/12/2015 Reason for consult: Initial assessment Baby at 17 hr of life and mom reports bf is going well. Denies breast or nipple pain, no concerns voiced. She did not bf her other 2 children because they would not latch. She does plan to bf and use formula, encouraged ebf while in the hospital. She stated that she can manually express and has a spoon in the room. Discussed baby behavior, sts, feeding frequency, baby belly size, voids, wt loss, breast changes, and nipple care. Given lactation handouts. Aware of OP services and support group. She plans to f/u with WIC on 05/13/15.     Maternal Data Has patient been taught Hand Expression?: Yes Does the patient have breastfeeding experience prior to this delivery?: No  Feeding Feeding Type: Breast Fed Length of feed: 10 min  LATCH Score/Interventions Latch: Repeated attempts needed to sustain latch, nipple held in mouth throughout feeding, stimulation needed to elicit sucking reflex. Intervention(s): Skin to skin Intervention(s): Adjust position;Assist with latch;Breast massage;Breast compression  Audible Swallowing: A few with stimulation Intervention(s): Skin to skin  Type of Nipple: Everted at rest and after stimulation  Comfort (Breast/Nipple): Soft / non-tender     Hold (Positioning): Assistance needed to correctly position infant at breast and maintain latch.  LATCH Score: 7  Lactation Tools Discussed/Used WIC Program: Yes   Consult Status Consult Status: Follow-up Date: 05/13/15 Follow-up type: In-patient    Rulon Eisenmengerlizabeth E Livie Vanderhoof 05/12/2015, 6:32 PM

## 2015-05-13 NOTE — Progress Notes (Signed)
UR chart review completed.  

## 2015-05-13 NOTE — Discharge Summary (Signed)
Obstetric Discharge Summary Reason for Admission: onset of labor Prenatal Procedures: none Intrapartum Procedures: spontaneous vaginal delivery Postpartum Procedures: none Complications-Operative and Postpartum: none HEMOGLOBIN  Date Value Ref Range Status  05/12/2015 13.0 12.0 - 15.0 g/dL Final   HCT  Date Value Ref Range Status  05/12/2015 38.2 36.0 - 46.0 % Final    Physical Exam:  General: alert Lochia: appropriate Uterine Fundus: firm Incision: healing well DVT Evaluation: No evidence of DVT seen on physical exam.  Discharge Diagnoses: Term Pregnancy-delivered  Discharge Information: Date: 05/13/2015 Activity: pelvic rest Diet: routine Medications: Percocet Condition: stable Instructions: refer to practice specific booklet Discharge to: home Follow-up Information    Follow up with Annette [name],Annette Droz A, MD.   Specialty:  Obstetrics and Gynecology   Contact information:   [address] [place] [address] [phone]       Newborn Data: Live born female  Birth Weight: 6 lb 8.4 oz (2960 g) APGAR: 9, 9  Home with mother.  Annette [name] [name] 05/13/2015, 7:28 AM

## 2015-05-13 NOTE — Discharge Instructions (Signed)
Discharge instructions ° °· You can wash your hair °· Shower °· Eat what you want °· Drink what you want °· See me in 6 weeks °· Your ankles are going to swell more in the next 2 weeks than when pregnant °· No sex for 6 weeks ° ° °Gelene Recktenwald A, MD 05/13/2015 ° ° °

## 2015-05-13 NOTE — Progress Notes (Signed)
Patient ID: Annette FiddlerYolanda [name], female   DOB: 02/18/1981, 35 y.o.   MRN: [id number] Postpartum day one Blood pressure 118/75 pulse 76 respirations 18 Fundus firm Lochia moderate Legs negative home today

## 2015-05-13 NOTE — Progress Notes (Signed)
Mother stated through interpreter that [facility], and the FLU vaccine was given at a drug store. Written and verbal information given regarding the MMR vaccine.

## 2015-11-26 ENCOUNTER — Encounter: Payer: Self-pay | Admitting: *Deleted

## 2017-02-10 IMAGING — CR DG CHEST 2V
2 series · 2 of 2 positions shown · non-contrast
Comparison: Chest x-ray 01/25/2015.

CLINICAL DATA: 34-year-old female with low oxygen saturation. Mild
shortness of breath.

EXAM:
CHEST  2 VIEW

[chest pa]
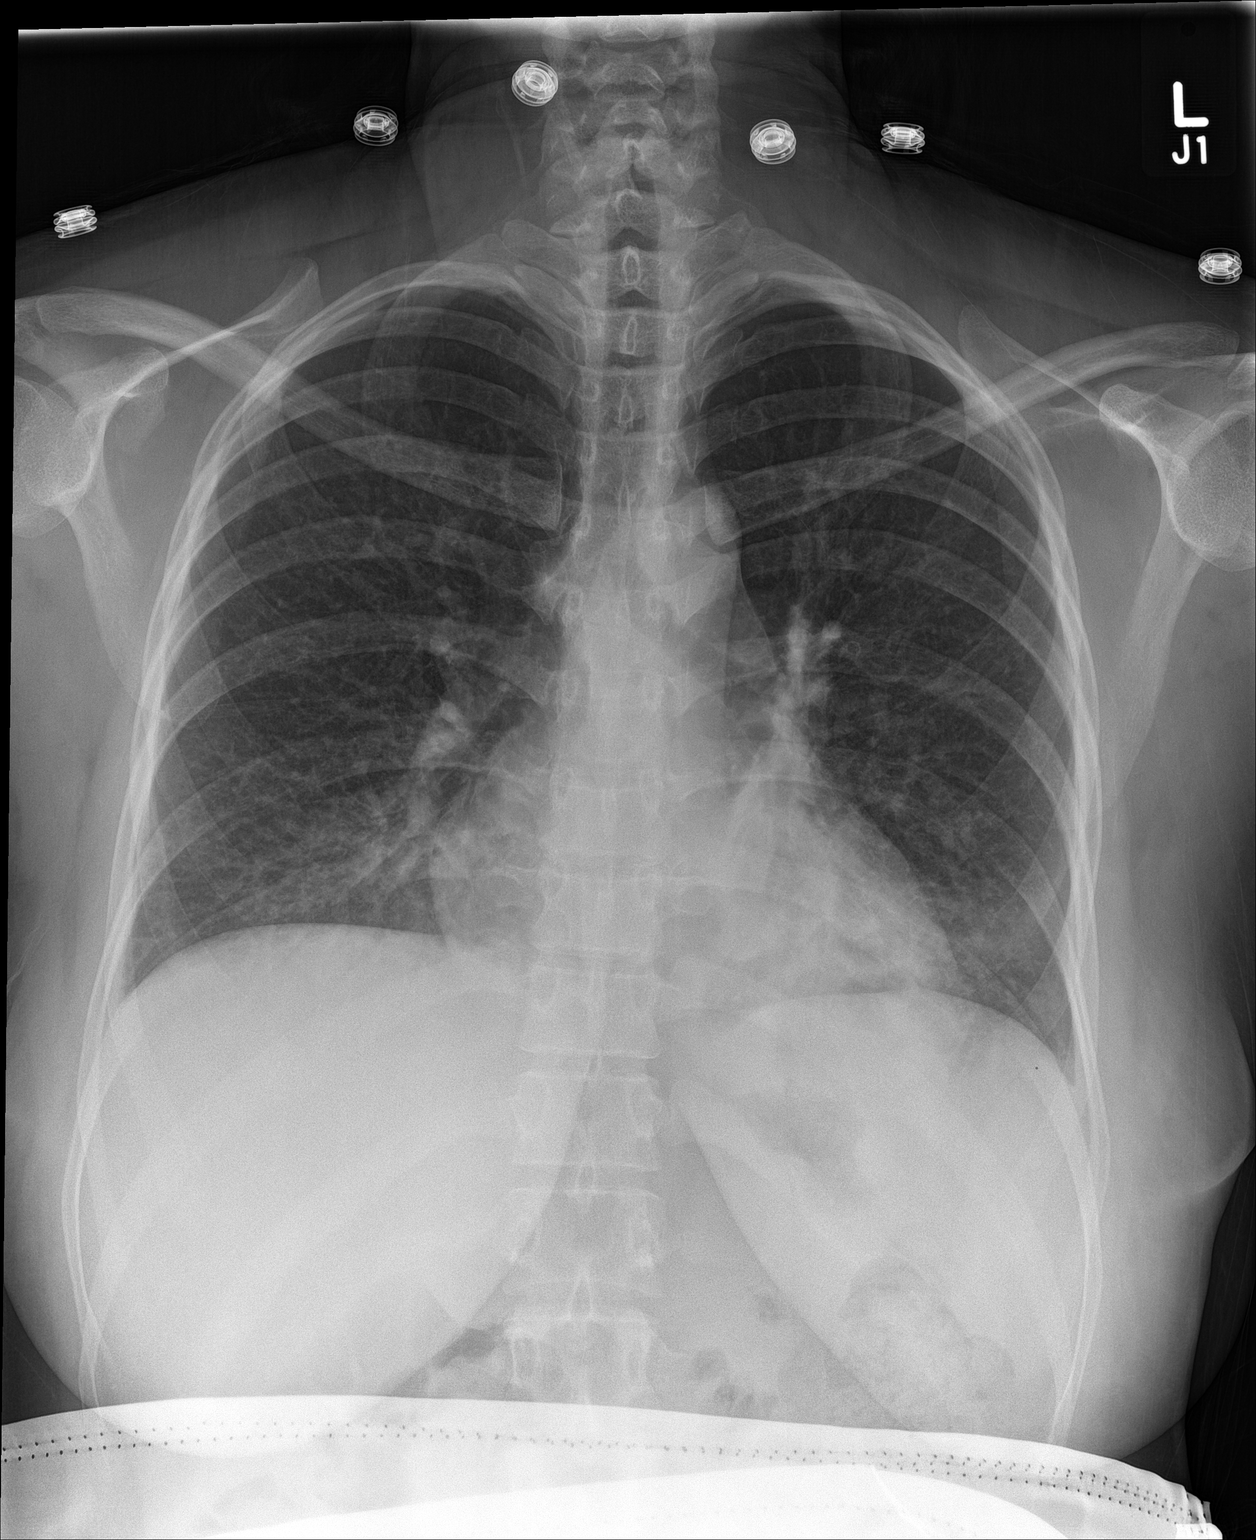

[chest lat]
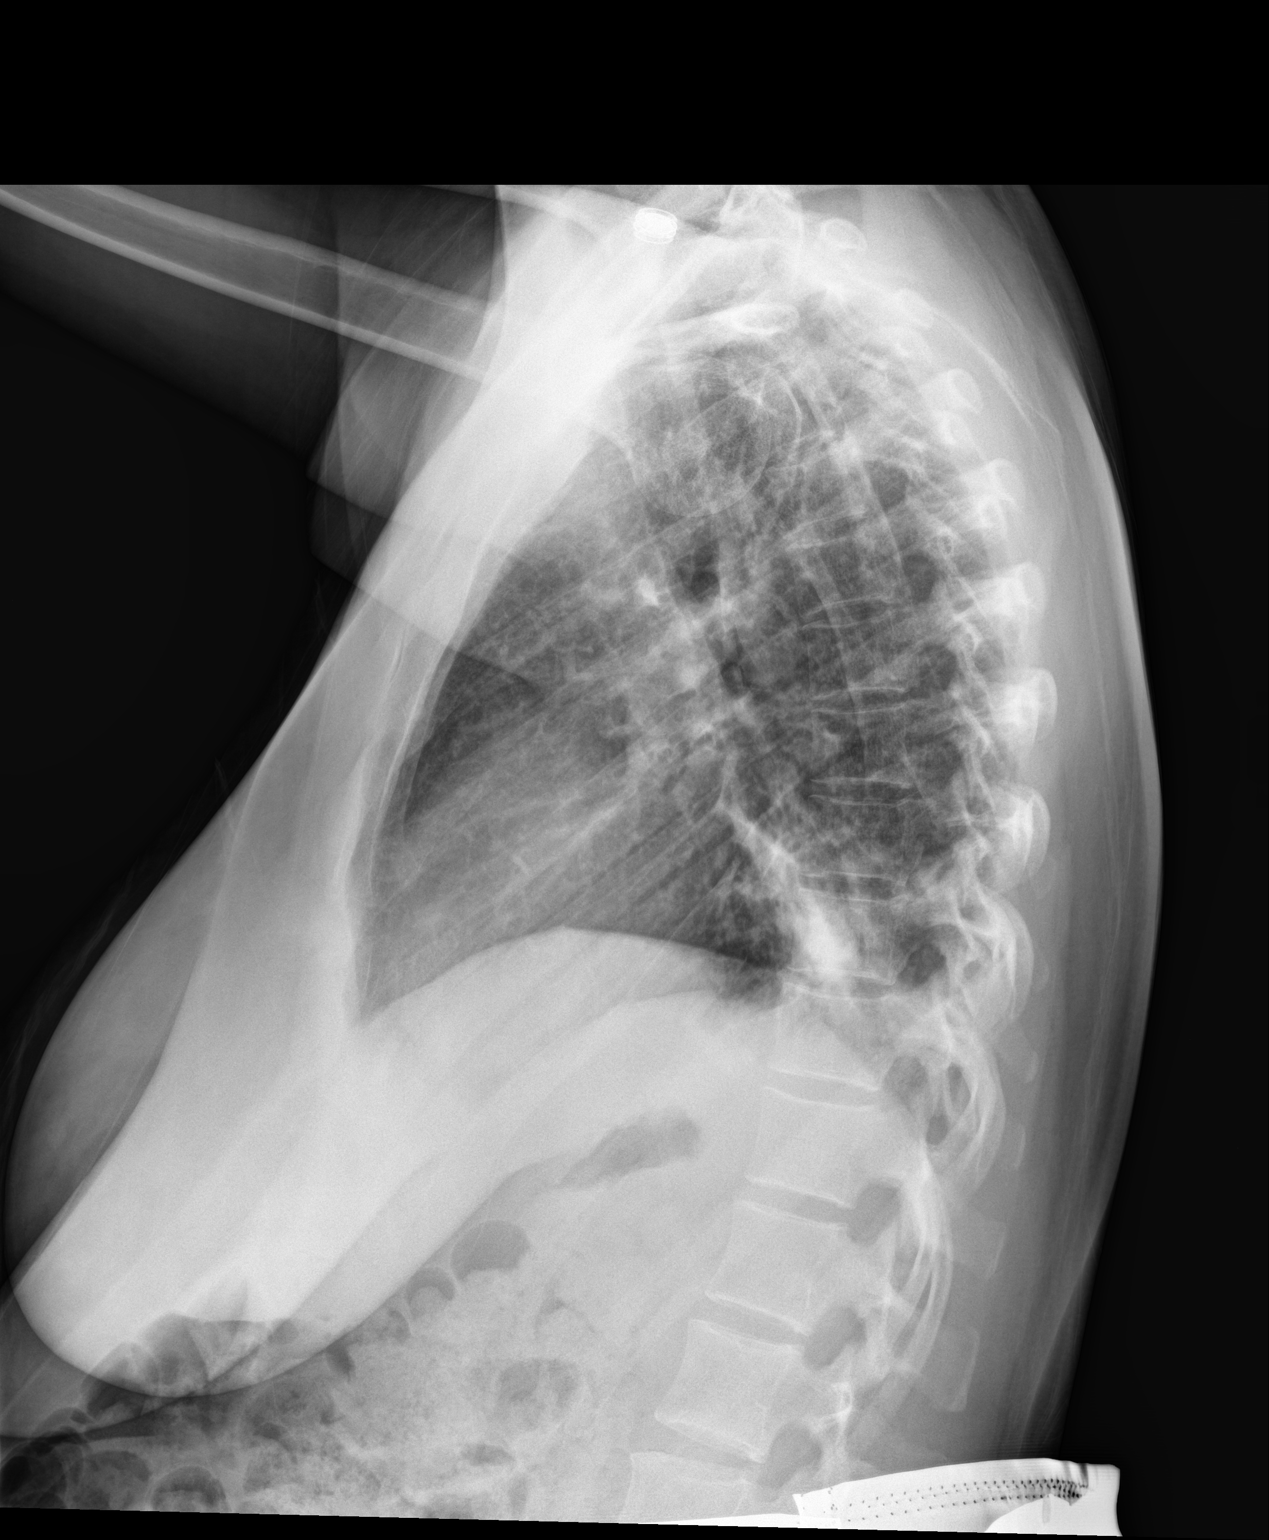

[2 of 2 positions shown; findings below may reference images not displayed]

FINDINGS: Worsening bibasilar consolidation, particularly in the left lower
lobe, concerning for progressively worsening pneumonia or sequela of
recent aspiration. Upper lungs are clear. Probable trace left
pleural effusion. No evidence of pulmonary edema. Heart size and
upper mediastinal contours are within normal limits.
IMPRESSION: 1. Worsening bibasilar consolidation, particularly in the left lower
lobe, concerning for worsening pneumonia or sequela of recent
aspiration.
2. Trace left pleural effusion.

## 2021-02-28 ENCOUNTER — Ambulatory Visit (HOSPITAL_COMMUNITY)
Admission: EM | Admit: 2021-02-28 | Discharge: 2021-02-28 | Disposition: A | Discharge status: Home / Self Care | Payer: Self-pay | Attending: Internal Medicine | Admitting: Internal Medicine

## 2021-02-28 ENCOUNTER — Other Ambulatory Visit: Payer: Self-pay

## 2021-02-28 ENCOUNTER — Encounter (HOSPITAL_COMMUNITY): Payer: Self-pay | Admitting: *Deleted

## 2021-02-28 DIAGNOSIS — N3001 Acute cystitis with hematuria: Secondary | ICD-10-CM | POA: Insufficient documentation

## 2021-02-28 LAB — POCT URINALYSIS DIPSTICK, ED / UC
Bilirubin Urine: NEGATIVE
Glucose, UA: NEGATIVE mg/dL
Ketones, ur: NEGATIVE mg/dL
Nitrite: NEGATIVE
Protein, ur: NEGATIVE mg/dL
Specific Gravity, Urine: 1.015 (ref 1.005–1.030)
Urobilinogen, UA: 0.2 mg/dL (ref 0.0–1.0)
pH: 6 (ref 5.0–8.0)

## 2021-02-28 LAB — POC URINE PREG, ED: Preg Test, Ur: NEGATIVE

## 2021-02-28 MED ORDER — KETOROLAC TROMETHAMINE 30 MG/ML IJ SOLN
30.0000 mg | Freq: Once | INTRAMUSCULAR | Status: AC
  Administered 2021-02-28: 12:00:00 30 mg via INTRAMUSCULAR

## 2021-02-28 MED ORDER — KETOROLAC TROMETHAMINE 30 MG/ML IJ SOLN
INTRAMUSCULAR | Status: AC
  Filled 2021-02-28: qty 1

## 2021-02-28 MED ORDER — IBUPROFEN 600 MG PO TABS: 600.0000 mg | ORAL_TABLET | Freq: Four times a day (QID) | ORAL | 0 refills | Status: DC | PRN

## 2021-02-28 MED ORDER — CEPHALEXIN 500 MG PO CAPS: 500.0000 mg | ORAL_CAPSULE | Freq: Two times a day (BID) | ORAL | 0 refills | Status: AC

## 2021-02-28 NOTE — ED Triage Notes (Signed)
Pt reports for one weeks she as had dysuria,bilateral flank pain .

## 2021-02-28 NOTE — Discharge Instructions (Addendum)
Increase oral fluid intake Take medications as prescribed We will call you with recommendations if urine cultures are significant Return to urgent care if symptoms worsen.

## 2021-02-28 NOTE — ED Provider Notes (Signed)
[facility]    CSN: [id number] Arrival date & time: 02/28/21  7829      History   Chief Complaint Chief Complaint  Patient presents with   Dysuria   Abdominal Pain    HPI Annette [name] is a 40 y.o. female comes to the urgent care with 1 week history of dysuria, urgency and lower abdominal pain as well as back pain.  Patient's symptoms started a week ago and has worsened.  No blood in urine.  Patient describes moderately severe sharp and back pain.  Pain is aggravated by movement.  She denies any relieving factors for the back pain.  No falls or trauma.  No heavy lifting.  Pain is not in the flank region.  She denies any history of kidney stones.  Patient has dysuria, urgency and frequency.  No blood in urine.  No nausea or vomiting.  Patient denies any fever.  No vaginal discharge.   HPI  Past Medical History:  Diagnosis Date   Pneumonia     Patient Active Problem List   Diagnosis Date Noted   NVD (normal vaginal delivery) 05/12/2015   Active labor 05/11/2015   Pneumonia affecting pregnancy in second trimester 01/25/2015    Past Surgical History:  Procedure Laterality Date   CESAREAN SECTION     LASIK      OB History     Gravida  4   Para  3   Term  3   Preterm      AB  1   Living  1      SAB  1   IAB      Ectopic      Multiple  0   Live Births  1            Home Medications    Prior to Admission medications   Medication Sig Start Date End Date Taking? Authorizing Provider  cephALEXin (KEFLEX) 500 MG capsule Take 1 capsule (500 mg total) by mouth 2 (two) times daily for 5 days. 02/28/21 03/05/21 Yes Onie Hayashi, Britta Mccreedy, MD  ibuprofen (ADVIL) 600 MG tablet Take 1 tablet (600 mg total) by mouth every 6 (six) hours as needed. 02/28/21  Yes Cruz Bong, Britta Mccreedy, MD    Family History History reviewed. No pertinent family history.  Social History Social History   Tobacco Use   Smoking status: Never   Smokeless tobacco:  Never  Substance Use Topics   Alcohol use: No   Drug use: No     Allergies   Patient has no known allergies.   Review of Systems Review of Systems  HENT: Negative.    Respiratory: Negative.    Gastrointestinal:  Positive for abdominal pain.  Genitourinary:  Positive for dysuria, frequency and urgency. Negative for difficulty urinating.  Musculoskeletal:  Positive for back pain and myalgias. Negative for arthralgias.    Physical Exam Triage Vital Signs ED Triage Vitals  Enc Vitals Group     BP 02/28/21 1135 118/70     Pulse Rate 02/28/21 1135 71     Resp 02/28/21 1135 18     Temp 02/28/21 1135 98.2 F (36.8 C)     Temp src --      SpO2 02/28/21 1135 100 %     Weight --      Height --      Head Circumference --      Peak Flow --      Pain Score 02/28/21 1132 10  Pain Loc --      Pain Edu? --      Excl. in [place]? --    No data found.  Updated Vital Signs BP 118/70    Pulse 71    Temp 98.2 F (36.8 C)    Resp 18    LMP 02/09/2021    SpO2 100%   Visual Acuity Right Eye Distance:   Left Eye Distance:   Bilateral Distance:    Right Eye Near:   Left Eye Near:    Bilateral Near:     Physical Exam Vitals and nursing note reviewed.  Constitutional:      General: She is not in acute distress.    Appearance: She is not ill-appearing.  Cardiovascular:     Rate and Rhythm: Normal rate and regular rhythm.  Abdominal:     General: Bowel sounds are normal.     Palpations: Abdomen is soft. There is no shifting dullness.     Tenderness: There is no abdominal tenderness.     Hernia: No hernia is present.  Neurological:     Mental Status: She is alert.     UC Treatments / Results  Labs (all labs ordered are listed, but only abnormal results are displayed) Labs Reviewed  POCT URINALYSIS DIPSTICK, ED / UC - Abnormal; Notable for the following components:      Result Value   Hgb urine dipstick TRACE (*)    Leukocytes,Ua SMALL (*)    All other components within  normal limits  URINE CULTURE  POC URINE PREG, ED    EKG   Radiology No results found.  Procedures Procedures (including critical care time)  Medications Ordered in UC Medications  ketorolac (TORADOL) 30 MG/ML injection 30 mg (30 mg Intramuscular Given 02/28/21 1223)    Initial Impression / Assessment and Plan / UC Course  I have reviewed the triage vital signs and the nursing notes.  Pertinent labs & imaging results that were available during my care of the patient were reviewed by me and considered in my medical decision making (see chart for details).     1.  Acute cystitis with hematuria: Point-of-care urinalysis is significant for hemoglobin and leukocyte Estrace Urine cultures have been sent Toradol 30 mg IM x1 dose for back pain Keflex 500 mg twice daily x5 days Ibuprofen 600 mg every 6 hours as needed for pain Return to urgent care if symptoms worsen. Final Clinical Impressions(s) / UC Diagnoses   Final diagnoses:  Acute cystitis with hematuria     Discharge Instructions      Increase oral fluid intake Take medications as prescribed We will call you with recommendations if urine cultures are significant Return to urgent care if symptoms worsen.   ED Prescriptions     Medication Sig Dispense Auth. Provider   cephALEXin (KEFLEX) 500 MG capsule Take 1 capsule (500 mg total) by mouth 2 (two) times daily for 5 days. 10 capsule Janae Bonser, Myrene Galas, MD   ibuprofen (ADVIL) 600 MG tablet Take 1 tablet (600 mg total) by mouth every 6 (six) hours as needed. 30 tablet Demeshia Sherburne, Myrene Galas, MD      PDMP not reviewed this encounter.   Chase Picket, MD 02/28/21 [phone]

## 2021-03-01 LAB — URINE CULTURE

## 2021-08-18 ENCOUNTER — Ambulatory Visit (INDEPENDENT_AMBULATORY_CARE_PROVIDER_SITE_OTHER): Payer: Self-pay | Admitting: Podiatry

## 2021-08-18 DIAGNOSIS — M79675 Pain in left toe(s): Secondary | ICD-10-CM

## 2021-08-18 DIAGNOSIS — B351 Tinea unguium: Secondary | ICD-10-CM

## 2021-08-18 MED ORDER — TERBINAFINE HCL 250 MG PO TABS: 250.0000 mg | ORAL_TABLET | Freq: Every day | ORAL | 0 refills | Status: DC

## 2021-08-18 NOTE — Progress Notes (Signed)
   Subjective: 41 y.o. female presenting today as a new patient for evaluation of discoloration with thickening of the right hallux nail plate.  This is been ongoing for about 2 years now.  Patient does recall an incident when she dropped a can of paint on her right great toenail.  After this incident the nail began to get thick and discolored.  She presents for further treatment and evaluation  Past Medical History:  Diagnosis Date   Pneumonia     Objective: Physical Exam General: The patient is alert and oriented x3 in no acute distress.  Dermatology: Hyperkeratotic, discolored, thickened, onychodystrophy noted right hallux nail plate. Skin is warm, dry and supple bilateral lower extremities. Negative for open lesions or macerations.  Vascular: Palpable pedal pulses bilaterally. No edema or erythema noted. Capillary refill within normal limits.  Neurological: Epicritic and protective threshold grossly intact bilaterally.   Musculoskeletal Exam: No pedal deformity  Assessment: #1 Onychomycosis of toenail right hallux nail plate #2 PMHx injury to right hallux when a can of pain fell on her toe  Plan of Care:  #1 Patient was evaluated. #2  Today we discussed different treatment options including oral, topical, and laser antifungal treatment modalities.  We discussed their efficacies and side effects.  Patient opts for oral antifungal treatment modality #3 prescription for Lamisil 250 mg #90 daily.  She denies a history of liver pathology or symptoms.  Patient is otherwise healthy #4  Also discussed with the patient the given history of injury which may create permanent disfigurement of the nail plate despite any fungal nail infection.  I explained to the patient that I can treat the nail fungus however if there is any residual disfigurement it may be permanent due to the history of injury  #5 return to clinic 6 months   Felecia Shelling, DPM [facility]  Dr. Felecia Shelling, DPM    [address] [place], [place] [address]                Office [phone]  Fax [phone]

## 2021-10-15 ENCOUNTER — Other Ambulatory Visit: Payer: Self-pay

## 2021-10-15 ENCOUNTER — Ambulatory Visit (INDEPENDENT_AMBULATORY_CARE_PROVIDER_SITE_OTHER): Payer: Self-pay | Admitting: Family Medicine

## 2021-10-15 VITALS — BP 103/63 | HR 63 | Wt 149.4 lb

## 2021-10-15 DIAGNOSIS — O219 Vomiting of pregnancy, unspecified: Secondary | ICD-10-CM

## 2021-10-15 DIAGNOSIS — Z3201 Encounter for pregnancy test, result positive: Secondary | ICD-10-CM

## 2021-10-15 DIAGNOSIS — Z3200 Encounter for pregnancy test, result unknown: Secondary | ICD-10-CM

## 2021-10-15 LAB — POCT PREGNANCY, URINE: Preg Test, Ur: POSITIVE — AB

## 2021-10-15 MED ORDER — PROMETHAZINE HCL 25 MG PO TABS: 25.0000 mg | ORAL_TABLET | Freq: Four times a day (QID) | ORAL | 5 refills | Status: DC | PRN

## 2021-10-15 NOTE — Progress Notes (Addendum)
Possible Pregnancy  Here today for pregnancy confirmation. UPT in office today is positive. Pt reports first positive home UPT approx 2 weeks ago. Reviewed dating with patient:   LMP: 08/23/21 EDD: 05/30/22 7w 4d today  OB history reviewed; reports single miscarriage, single c-section followed by two successful VBAC deliveries. Reports developing a pneumonia during last 2 pregnancies; encouraged COVID and Flu vaccine as soon as possible. Reviewed medications and allergies with patient; list of medications safe to take during pregnancy given.  Recommended pt begin prenatal vitamin and schedule prenatal care. Pt desires to be seen in our office. Reviewed financial application. Pt to scheduled appts during check out. Offered CenteringPregnancy; pt accepts.  Encounter completed with interpreter Eda.  Marjo Bicker, RN 10/15/2021  8:49 AM

## 2021-10-15 NOTE — Patient Instructions (Signed)

## 2021-10-15 NOTE — Progress Notes (Signed)
  History:  Ms. Annette [name] is a 41 y.o. [phone] who presents to clinic today with complaint of possible pregnancy.   Possible pregnancy UPT positive here Desired and planned pregnancy Off birth control for the past almost two years and had regular periods prior to conceiving Patient's last menstrual period was 08/23/2021. Reports no medical conditions or complications in prior pregnancies  Past Medical History:  Diagnosis Date   Pneumonia     Past Surgical History:  Procedure Laterality Date   CESAREAN SECTION     LASIK      The following portions of the patient's history were reviewed and updated as appropriate: allergies, current medications, past family history, past medical history, past social history, past surgical history and problem list.   Review of Systems:  Pertinent items noted in HPI and remainder of comprehensive ROS otherwise negative.  Objective:  Physical Exam BP 103/63   Pulse 63   Wt 149 lb 6.4 oz (67.8 kg)   LMP 08/23/2021   BMI 29.18 kg/m  Physical Exam Vitals reviewed.  Constitutional:      General: She is not in acute distress.    Appearance: She is well-developed. She is not diaphoretic.  Eyes:     General: No scleral icterus. Pulmonary:     Effort: Pulmonary effort is normal. No respiratory distress.  Skin:    General: Skin is warm and dry.  Neurological:     Mental Status: She is alert.     Coordination: Coordination normal.      Labs and Imaging Results for orders placed or performed in visit on 10/15/21 (from the past 24 hour(s))  Pregnancy, urine POC     Status: Abnormal   Collection Time: 10/15/21  8:46 AM  Result Value Ref Range   Preg Test, Ur POSITIVE (A) NEGATIVE    No results found.   Assessment & Plan:  Nausea and vomiting during pregnancy - Plan: promethazine (PHENERGAN) 25 MG tablet  Possible pregnancy - Plan: Pregnancy, urine POC  Congratulated Chart reviewed, one cesarean for FTP with OP baby,  subsequently two uncomplicated SVD Plans to start centering care   Approximately 15 minutes of total time was spent with this patient on history taking, coordination of care, education and documentation.   Venora Maples, MD 10/15/2021 9:44 AM

## 2021-10-29 ENCOUNTER — Telehealth: Payer: Self-pay

## 2021-10-30 ENCOUNTER — Encounter: Payer: Self-pay | Admitting: *Deleted

## 2021-10-30 ENCOUNTER — Telehealth: Payer: Self-pay | Admitting: *Deleted

## 2021-10-30 DIAGNOSIS — Z789 Other specified health status: Secondary | ICD-10-CM | POA: Insufficient documentation

## 2021-10-30 DIAGNOSIS — O0990 Supervision of high risk pregnancy, unspecified, unspecified trimester: Secondary | ICD-10-CM | POA: Insufficient documentation

## 2021-10-30 DIAGNOSIS — O09529 Supervision of elderly multigravida, unspecified trimester: Secondary | ICD-10-CM | POA: Insufficient documentation

## 2021-10-30 NOTE — Telephone Encounter (Signed)
I called Annette [name] with Interpreter Eda Royal to offer CenteringPregnancy and clarify she can join in December, until then will have traditional visits. She accepted Centering and will be enrolled. [name]

## 2021-11-05 ENCOUNTER — Telehealth: Payer: Self-pay

## 2021-11-05 ENCOUNTER — Telehealth (INDEPENDENT_AMBULATORY_CARE_PROVIDER_SITE_OTHER): Payer: Self-pay | Admitting: *Deleted

## 2021-11-05 DIAGNOSIS — O0990 Supervision of high risk pregnancy, unspecified, unspecified trimester: Secondary | ICD-10-CM

## 2021-11-05 DIAGNOSIS — Z789 Other specified health status: Secondary | ICD-10-CM

## 2021-11-05 DIAGNOSIS — O09529 Supervision of elderly multigravida, unspecified trimester: Secondary | ICD-10-CM

## 2021-11-05 NOTE — Patient Instructions (Addendum)
?  At our [facility], we work as an integrated team, providing care to address both physical and emotional health. Your medical provider may refer you to see our Behavioral Health Clinician (BHC) on the same day you see your medical provider, as availability permits; often scheduled virtually at your convenience.  ?Our BHC is available to all patients, visits generally last between 20-30 minutes, but can be longer or shorter, depending on patient need. The BHC offers help with stress management, coping with symptoms of depression and anxiety, major life changes , sleep issues, changing risky behavior, grief and loss, life stress, working on personal life goals, and  behavioral health issues, as these all affect your overall health and wellness.  ?The BHC is NOT available for the following: FMLA paperwork, court-ordered evaluations, specialty assessments (custody or disability), letters to employers, or obtaining certification for an emotional support animal. The BHC does not provide long-term therapy. ?You have the right to refuse integrated behavioral health services, or to reschedule to see the BHC at a later date.  ?Confidentiality exception: If it is suspected that a child or disabled adult is being abused or neglected, we are required by law to report that to either Child Protective Services or Adult Protective Services.  ?If you have a diagnosis of Bipolar affective disorder, Schizophrenia, or recurrent Major depressive disorder, we will recommend that you establish care with a psychiatrist, as these are lifelong, chronic conditions, and we want your overall emotional health and medications to be more closely monitored. If you anticipate needing extended maternity leave due to mental health issues postpartum, it it recommended you inform your medical provider, so we can put in a referral to a psychiatrist as soon as possible. The BHC is unable to recommend an extended maternity leave for mental  health issues. ?Your medical provider or BHC may refer you to a therapist for ongoing, traditional therapy, or to a psychiatrist, for medication management, if it would benefit your overall health. ?Depending on your insurance, you may have a copay or be charged a deductible, depending on your insurance, to see the BHC. If you are uninsured, it is recommended that you apply for financial assistance. (Forms may be requested at the front desk for in-person visits, via MyChart, or request a form during a virtual visit).  ?If you see the BHC more than 6 times, you will have to complete a comprehensive clinical assessment interview with the BHC to resume integrated services.  ?For virtual visits with the BHC, you must be physically in the state of [place] at the time of the visit. For example, if you live in [place], you will have to do an in-person visit with the BHC, and your out-of-state insurance may not cover behavioral health services in [place]. If you are going out of the state or country for any reason, the BHC may see you virtually when you return to [place], but not while you are physically outside of [place].  ?  ?Here is a link to the Pregnancy Navigators . Please ?Fill out prior to your New OB appointment.  ? ?English Link: https://[email or web address].tfaforms.[email or web address]?[email or web address] ? ?Spanish Link: https://[email or web address].tfaforms.[email or web address]?[email or web address]  ?[email or web address] is a website with information to help you prepare for Labor and delivery, patient information, visitor information and more.   ?

## 2021-11-05 NOTE — Progress Notes (Signed)
New OB Intake  I connected with  Annette [name] on 11/05/21 at  1:15 PM EDT by Telephone Visit and verified that I am speaking with the correct person using two identifiers. She was unable to join by [employer] . Nurse is located at [facility] and pt is located at home.  I discussed the limitations, risks, security and privacy concerns of performing an evaluation and management service by telephone and the availability of in person appointments. I also discussed with the patient that there may be a patient responsible charge related to this service. The patient expressed understanding and agreed to proceed.  I explained I am completing New OB Intake today. We discussed her EDD of 05/30/22 that is based on LMP of 08/23/21. Pt is G5/P3013. I reviewed her allergies, medications, Medical/Surgical/OB history, and appropriate screenings. I informed her of [facility] services. [facility] information placed in AVS. Based on history, this is a/an  pregnancy complicated by AMA, Language Barrier  .   Patient Active Problem List   Diagnosis Date Noted   Supervision of high risk pregnancy, antepartum 10/30/2021   AMA (advanced maternal age) multigravida 35+ 10/30/2021   Language barrier 10/30/2021    Concerns addressed today  Delivery Plans Plans to deliver at [facility]. Patient given information for [facility] Healthy [facility] website for more information about [facility]. Patient  not a candidate due to hx c/s  MyChart/Babyscripts MyChart access verified. I explained pt will have some visits in office and some virtually.   Blood Pressure Cuff/Weight Scale Patient is self-pay; explained patient will be given BP cuff at first prenatal appt. Explained after first prenatal appt pt will check weekly and document in Babyscripts. Patient does not  have weight scale. .   Anatomy US Explained first scheduled [place] will be around 19 weeks. Anatomy US scheduled for 01/05/22 at 1030. Pt notified to arrive at  1015.  Labs Discussed Avelina Laine genetic screening with patient. Would like to discuss with husband . Routine prenatal labs needed.  Covid Vaccine Patient has not had covid vaccine.   Is patient a CenteringPregnancy candidate?  Accepted but explained we will not have a new Spanish CenteringPregnancy start as we had originally planned so she will be enrolled in traditional ob care.    Is patient a Mom+Baby Combined Care candidate?  Not a candidate     Social Determinants of Health Food Insecurity: Patient denies food insecurity. WIC Referral: Patient is interested in referral to [facility].  Transportation: Patient denies transportation needs. Childcare: Discussed no children allowed at ultrasound appointments. Offered childcare services; patient declines childcare services at this time.  First visit review I reviewed new OB appt with pt. I explained she will have a provider visit that includes exam/ blood work/pap . Explained pt will be seen by Dr. Donavan Foil at first visit; encounter routed to appropriate provider. Explained that patient will be seen by pregnancy navigator following visit with provider.   [name] 11/05/2021  1:29 PM

## 2021-11-07 NOTE — Progress Notes (Signed)
Patient was assessed and managed by nursing staff during this encounter. I have reviewed the chart and agree with the documentation and plan. I have also made any necessary editorial changes.  Warden Fillers, MD 11/07/2021 8:23 AM

## 2021-11-13 ENCOUNTER — Telehealth: Payer: Self-pay | Admitting: *Deleted

## 2021-11-13 ENCOUNTER — Telehealth: Payer: Self-pay

## 2021-11-13 NOTE — Telephone Encounter (Signed)
I called her with Interpreter Eda Royal and left message I have sent her email with to sign up for app we discussed. (Babyscripts in [place]). Please check your email. [name]

## 2021-11-26 ENCOUNTER — Other Ambulatory Visit: Payer: Self-pay

## 2021-11-26 ENCOUNTER — Encounter: Payer: Self-pay | Admitting: Obstetrics and Gynecology

## 2021-11-26 ENCOUNTER — Other Ambulatory Visit (HOSPITAL_COMMUNITY)
Admission: RE | Admit: 2021-11-26 | Discharge: 2021-11-26 | Disposition: A | Discharge status: Home / Self Care | Payer: Self-pay | Source: Ambulatory Visit | Attending: Obstetrics and Gynecology | Admitting: Obstetrics and Gynecology

## 2021-11-26 ENCOUNTER — Ambulatory Visit (INDEPENDENT_AMBULATORY_CARE_PROVIDER_SITE_OTHER): Payer: Self-pay | Admitting: Obstetrics and Gynecology

## 2021-11-26 VITALS — BP 110/63 | HR 77 | Wt 152.7 lb

## 2021-11-26 DIAGNOSIS — Z789 Other specified health status: Secondary | ICD-10-CM

## 2021-11-26 DIAGNOSIS — O09521 Supervision of elderly multigravida, first trimester: Secondary | ICD-10-CM

## 2021-11-26 DIAGNOSIS — O09522 Supervision of elderly multigravida, second trimester: Secondary | ICD-10-CM

## 2021-11-26 DIAGNOSIS — O34219 Maternal care for unspecified type scar from previous cesarean delivery: Secondary | ICD-10-CM | POA: Insufficient documentation

## 2021-11-26 DIAGNOSIS — O0991 Supervision of high risk pregnancy, unspecified, first trimester: Secondary | ICD-10-CM

## 2021-11-26 DIAGNOSIS — O0990 Supervision of high risk pregnancy, unspecified, unspecified trimester: Secondary | ICD-10-CM

## 2021-11-26 DIAGNOSIS — Z98891 History of uterine scar from previous surgery: Secondary | ICD-10-CM | POA: Insufficient documentation

## 2021-11-26 DIAGNOSIS — Z3A13 13 weeks gestation of pregnancy: Secondary | ICD-10-CM

## 2021-11-26 NOTE — Progress Notes (Signed)
INITIAL PRENATAL VISIT NOTE  Subjective:  Annette [name] is a 41 y.o. [id number] at [redacted]w[redacted]d by LMP being seen today for her initial prenatal visit.  She has an obstetric history significant for c/s x 1 and VBAC x 2. She has an uncomplicated medical history .  Patient reports no complaints.  Contractions: Not present. Vag. Bleeding: None.  Movement: Absent. Denies leaking of fluid.    Past Medical History:  Diagnosis Date   Pneumonia     Past Surgical History:  Procedure Laterality Date   CESAREAN SECTION  2006   LASIK  2013    OB History  Gravida Para Term Preterm AB Living  5 3 3  0 1 3  SAB IAB Ectopic Multiple Live Births  1 0 0 0 3    # Outcome Date GA Lbr Len/2nd Weight Sex Delivery Anes PTL Lv  5 Current           4 Term 05/12/15 [redacted]w[redacted]d 08:25 / 00:58 6 lb 8.4 oz (2.96 kg) F VBAC EPI  LIV  3 Term 05/17/09 [redacted]w[redacted]d  6 lb 12 oz (3.062 kg) F VBAC   LIV     Birth Comments: wnl  2 Term 12/24/04 [redacted]w[redacted]d  6 lb 15 oz (3.147 kg) M CS-LTranv EPI  LIV     Birth Comments: C/S due to FTP, fever  1 SAB 2005 [redacted]w[redacted]d           Social History   Socioeconomic History   Marital status: Married    Spouse name: Not on file   Number of children: Not on file   Years of education: Not on file   Highest education level: Not on file  Occupational History   Not on file  Tobacco Use   Smoking status: Never   Smokeless tobacco: Never  Vaping Use   Vaping Use: Never used  Substance and Sexual Activity   Alcohol use: No   Drug use: No   Sexual activity: Yes    Birth control/protection: None  Other Topics Concern   Not on file  Social History Narrative   Not on file   Social Determinants of Health   Financial Resource Strain: Not on file  Food Insecurity: No Food Insecurity (11/05/2021)   Hunger Vital Sign    Worried About Running Out of Food in the Last Year: Never true    Ran Out of Food in the Last Year: Never true  Transportation Needs: No Transportation Needs (11/05/2021)    PRAPARE - 01/05/2022 (Medical): No    Lack of Transportation (Non-Medical): No  Physical Activity: Not on file  Stress: Not on file  Social Connections: Not on file    History reviewed. No pertinent family history.   Current Outpatient Medications:    Prenatal Vit-Fe Fumarate-FA (PRENATAL VITAMIN PO), Take 1 tablet by mouth daily., Disp: , Rfl:    promethazine (PHENERGAN) 25 MG tablet, Take 1 tablet (25 mg total) by mouth every 6 (six) hours as needed for nausea or vomiting. (Patient not taking: Reported on 11/05/2021), Disp: 30 tablet, Rfl: 5   terbinafine (LAMISIL) 250 MG tablet, Take 1 tablet (250 mg total) by mouth daily. (Patient not taking: Reported on 11/26/2021), Disp: 90 tablet, Rfl: 0  No Known Allergies  Review of Systems: Negative except for what is mentioned in HPI.  Objective:   Vitals:   11/26/21 1542  BP: 110/63  Pulse: 77  Weight: 152 lb 11.2 oz (69.3 kg)  Fetal Status: Fetal Heart Rate (bpm): 148   Movement: Absent     Physical Exam: BP 110/63   Pulse 77   Wt 152 lb 11.2 oz (69.3 kg)   LMP 08/23/2021 (Exact Date)   BMI 29.82 kg/m  CONSTITUTIONAL: Well-developed, well-nourished female in no acute distress.  NEUROLOGIC: Alert and oriented to person, place, and time. Normal reflexes, muscle tone coordination. No cranial nerve deficit noted. PSYCHIATRIC: Normal mood and affect. Normal behavior. Normal judgment and thought content. SKIN: Skin is warm and dry. No rash noted. Not diaphoretic. No erythema. No pallor. HENT:  Normocephalic, atraumatic, External right and left ear normal. Oropharynx is clear and moist EYES: Conjunctivae and EOM are normal.  NECK: Normal range of motion, supple, no masses CARDIOVASCULAR: Normal heart rate noted, regular rhythm RESPIRATORY: Effort and breath sounds normal, no problems with respiration noted BREASTS: deferred ABDOMEN: Soft, nontender, nondistended, gravid. GU: normal appearing external  female genitalia, multiparous, normal appearing cervix, scant white discharge in vagina, no lesions noted, pap taken without incident, chaperone present Bimanual: 13 weeks sized uterus, no adnexal tenderness or palpable lesions noted MUSCULOSKELETAL: Normal range of motion. EXT:  No edema and no tenderness. 2+ distal pulses.   Assessment and Plan:  Pregnancy: [id number] at [redacted]w[redacted]d by LMP  1. Supervision of high risk pregnancy, antepartum Continue routine prenatal care  - Culture, OB Urine - CBC/D/Plt+RPR+Rh+ABO+RubIgG... - Hemoglobin A1c - Cytology - PAP( [place]) - GC/Chlamydia probe amp ([place])not at [facility]  2. [redacted] weeks gestation of pregnancy   3. Multigravida of advanced maternal age in second trimester   4. Language barrier Live interpreter present  5. Hx successful VBAC (vaginal birth after cesarean), currently pregnant Successful VBAC x 2  6. Hx of cesarean section    Preterm labor symptoms and general obstetric precautions including but not limited to vaginal bleeding, contractions, leaking of fluid and fetal movement were reviewed in detail with the patient.  Please refer to After Visit Summary for other counseling recommendations.   Return in about 4 weeks (around 12/24/2021) for [facility], in person.  [name] 11/26/2021 4:03 PM

## 2021-11-27 LAB — CBC/D/PLT+RPR+RH+ABO+RUBIGG...
Antibody Screen: NEGATIVE
Basophils Absolute: 0 10*3/uL (ref 0.0–0.2)
Basos: 0 %
EOS (ABSOLUTE): 0.1 10*3/uL (ref 0.0–0.4)
Eos: 1 %
HCV Ab: NONREACTIVE
HIV Screen 4th Generation wRfx: NONREACTIVE
Hematocrit: 36.7 % (ref 34.0–46.6)
Hemoglobin: 12.7 g/dL (ref 11.1–15.9)
Hepatitis B Surface Ag: NEGATIVE
Immature Grans (Abs): 0.1 10*3/uL (ref 0.0–0.1)
Immature Granulocytes: 1 %
Lymphocytes Absolute: 2.4 10*3/uL (ref 0.7–3.1)
Lymphs: 20 %
MCH: 30.7 pg (ref 26.6–33.0)
MCHC: 34.6 g/dL (ref 31.5–35.7)
MCV: 89 fL (ref 79–97)
Monocytes Absolute: 0.9 10*3/uL (ref 0.1–0.9)
Monocytes: 8 %
Neutrophils Absolute: 8.4 10*3/uL — ABNORMAL HIGH (ref 1.4–7.0)
Neutrophils: 70 %
Platelets: 236 10*3/uL (ref 150–450)
RBC: 4.14 x10E6/uL (ref 3.77–5.28)
RDW: 13.1 % (ref 11.7–15.4)
RPR Ser Ql: NONREACTIVE
Rh Factor: POSITIVE
Rubella Antibodies, IGG: <0.9 index — ABNORMAL LOW (ref >0.99)
WBC: 11.9 10*3/uL — ABNORMAL HIGH (ref 3.4–10.8)

## 2021-11-27 LAB — HEMOGLOBIN A1C
Est. average glucose Bld gHb Est-mCnc: 103 mg/dL
Hgb A1c MFr Bld: 5.2 % (ref 4.8–5.6)

## 2021-11-27 LAB — GC/CHLAMYDIA PROBE AMP (~~LOC~~) NOT AT ARMC
Chlamydia: NEGATIVE
Comment: NEGATIVE
Comment: NORMAL
Neisseria Gonorrhea: NEGATIVE

## 2021-11-27 LAB — HCV INTERPRETATION

## 2021-11-28 ENCOUNTER — Encounter: Payer: Self-pay | Admitting: Obstetrics and Gynecology

## 2021-11-28 DIAGNOSIS — Z789 Other specified health status: Secondary | ICD-10-CM | POA: Insufficient documentation

## 2021-11-28 HISTORY — DX: Other specified health status: Z78.9

## 2021-11-28 LAB — URINE CULTURE, OB REFLEX

## 2021-11-28 LAB — CULTURE, OB URINE

## 2021-12-01 LAB — CYTOLOGY - PAP
Comment: NEGATIVE
Diagnosis: NEGATIVE
High risk HPV: NEGATIVE

## 2021-12-25 ENCOUNTER — Other Ambulatory Visit: Payer: Self-pay

## 2021-12-25 ENCOUNTER — Ambulatory Visit (INDEPENDENT_AMBULATORY_CARE_PROVIDER_SITE_OTHER): Payer: Self-pay | Admitting: Family Medicine

## 2021-12-25 ENCOUNTER — Other Ambulatory Visit (HOSPITAL_COMMUNITY)
Admission: RE | Admit: 2021-12-25 | Discharge: 2021-12-25 | Disposition: A | Discharge status: Home / Self Care | Payer: Self-pay | Source: Ambulatory Visit | Attending: Family Medicine | Admitting: Family Medicine

## 2021-12-25 VITALS — BP 103/65 | HR 74 | Wt 157.0 lb

## 2021-12-25 DIAGNOSIS — N898 Other specified noninflammatory disorders of vagina: Secondary | ICD-10-CM | POA: Insufficient documentation

## 2021-12-25 DIAGNOSIS — O0992 Supervision of high risk pregnancy, unspecified, second trimester: Secondary | ICD-10-CM

## 2021-12-25 DIAGNOSIS — O0990 Supervision of high risk pregnancy, unspecified, unspecified trimester: Secondary | ICD-10-CM

## 2021-12-25 DIAGNOSIS — O09522 Supervision of elderly multigravida, second trimester: Secondary | ICD-10-CM

## 2021-12-25 DIAGNOSIS — Z3A17 17 weeks gestation of pregnancy: Secondary | ICD-10-CM

## 2021-12-25 DIAGNOSIS — Z789 Other specified health status: Secondary | ICD-10-CM

## 2021-12-25 DIAGNOSIS — Z98891 History of uterine scar from previous surgery: Secondary | ICD-10-CM

## 2021-12-25 MED ORDER — TERCONAZOLE 0.8 % VA CREA: 1.0000 | TOPICAL_CREAM | Freq: Every day | VAGINAL | 0 refills | Status: DC

## 2021-12-25 NOTE — Progress Notes (Signed)
   PRENATAL VISIT NOTE  Subjective:  [place] is a 41 y.o. [id number] at [redacted]w[redacted]d being seen today for ongoing prenatal care.  She is currently monitored for the following issues for this high-risk pregnancy and has Supervision of high risk pregnancy, antepartum; AMA (advanced maternal age) multigravida 64+; Language barrier; Hx successful VBAC (vaginal birth after cesarean), currently pregnant; Hx of cesarean section; and Not immune to rubella on their problem list.  Patient reports vaginal irritation.  Contractions: Not present. Vag. Bleeding: None.  Movement: Absent. Denies leaking of fluid.   The following portions of the patient's history were reviewed and updated as appropriate: allergies, current medications, past family history, past medical history, past social history, past surgical history and problem list.   Objective:   Vitals:   12/25/21 1544  BP: 103/65  Pulse: 74  Weight: 157 lb (71.2 kg)    Fetal Status: Fetal Heart Rate (bpm): 144   Movement: Absent     General:  Alert, oriented and cooperative. Patient is in no acute distress.  Skin: Skin is warm and dry. No rash noted.   Cardiovascular: Normal heart rate noted  Respiratory: Normal respiratory effort, no problems with respiration noted  Abdomen: Soft, gravid, appropriate for gestational age.  Pain/Pressure: Absent     Pelvic: Cervical exam performed in the presence of a chaperone adherent yellow discharge        Extremities: Normal range of motion.  Edema: None  Mental Status: Normal mood and affect. Normal behavior. Normal judgment and thought content.   Assessment and Plan:  Pregnancy: [id number] at [redacted]w[redacted]d 1. Supervision of high risk pregnancy, antepartum Continue routine prenatal care. AFP today - AFP, Serum, Open Spina Bifida  2. Multigravida of advanced maternal age in second trimester Declined genetics  3. Language barrier Spanish interpreter: Eda used  4. Not immune to rubella MMR pp  5. Hx  of cesarean section For TOLAC  6. Vagina itching Vag discharge c/w yeast - Cervicovaginal ancillary only( [place]) - terconazole (TERAZOL 3) 0.8 % vaginal cream; Place 1 applicator vaginally at bedtime.  Dispense: 20 g; Refill: 0  General obstetric precautions including but not limited to vaginal bleeding, contractions, leaking of fluid and fetal movement were reviewed in detail with the patient. Please refer to After Visit Summary for other counseling recommendations.   Return in 4 weeks (on 01/22/2022).  Future Appointments  Date Time Provider [place]  01/05/2022 10:15 AM [facility] NURSE [facility]  01/05/2022 10:30 AM [facility] US3 [facility]  01/20/2022  9:15 AM Dione Plover, Annice Needy, MD [facility]    Donnamae Jude, MD

## 2021-12-26 LAB — CERVICOVAGINAL ANCILLARY ONLY
Bacterial Vaginitis (gardnerella): POSITIVE — AB
Candida Glabrata: NEGATIVE
Candida Vaginitis: POSITIVE — AB
Chlamydia: NEGATIVE
Comment: NEGATIVE
Comment: NEGATIVE
Comment: NEGATIVE
Comment: NEGATIVE
Comment: NEGATIVE
Comment: NORMAL
Neisseria Gonorrhea: NEGATIVE
Trichomonas: NEGATIVE

## 2021-12-30 LAB — AFP, SERUM, OPEN SPINA BIFIDA
AFP MoM: 1.43
AFP Value: 58.6 ng/mL
Gest. Age on Collection Date: 17.7 weeks
Maternal Age At EDD: 40.2 yr
OSBR Risk 1 IN: 3311
Test Results:: NEGATIVE
Weight: 157 [lb_av]

## 2022-01-05 ENCOUNTER — Encounter: Payer: Self-pay | Admitting: *Deleted

## 2022-01-05 ENCOUNTER — Ambulatory Visit: Payer: Self-pay | Admitting: *Deleted

## 2022-01-05 ENCOUNTER — Ambulatory Visit: Payer: Self-pay | Attending: Obstetrics and Gynecology

## 2022-01-05 ENCOUNTER — Other Ambulatory Visit: Payer: Self-pay | Admitting: *Deleted

## 2022-01-05 ENCOUNTER — Telehealth: Payer: Self-pay | Admitting: *Deleted

## 2022-01-05 VITALS — BP 102/57 | HR 73

## 2022-01-05 DIAGNOSIS — O09523 Supervision of elderly multigravida, third trimester: Secondary | ICD-10-CM

## 2022-01-05 DIAGNOSIS — O0990 Supervision of high risk pregnancy, unspecified, unspecified trimester: Secondary | ICD-10-CM

## 2022-01-05 DIAGNOSIS — O09522 Supervision of elderly multigravida, second trimester: Secondary | ICD-10-CM | POA: Insufficient documentation

## 2022-01-05 DIAGNOSIS — O09529 Supervision of elderly multigravida, unspecified trimester: Secondary | ICD-10-CM | POA: Insufficient documentation

## 2022-01-05 DIAGNOSIS — Z789 Other specified health status: Secondary | ICD-10-CM | POA: Insufficient documentation

## 2022-01-05 DIAGNOSIS — O34219 Maternal care for unspecified type scar from previous cesarean delivery: Secondary | ICD-10-CM

## 2022-01-05 NOTE — Telephone Encounter (Addendum)
-----   Message from Donnamae Jude, MD sent at 01/05/2022  7:42 AM EST ----- Treated for yeast at [facility] if sx's persist, rx for BV may be given  11/6  1000 Called pt with interpreter Claudia and pt did not answer. A message was left stating that I am calling with non-urgent test results and will call back later.  11/6  1555  Called pt w/interpreter Rosemarie Ax. She stated that she has used the vaginal cream as prescribed and no longer has itching or irritation. She still has some white discharge.  She denies odor. I informed pt of test result positive for BV and offered treatment if desired.  Pt stated that since she is not having the odor, she will wait to see what happens. She agreed to let us know if her symptoms change. Pt had no questions.

## 2022-01-20 ENCOUNTER — Other Ambulatory Visit: Payer: Self-pay

## 2022-01-20 ENCOUNTER — Encounter: Payer: Self-pay | Admitting: Family Medicine

## 2022-01-20 ENCOUNTER — Ambulatory Visit (INDEPENDENT_AMBULATORY_CARE_PROVIDER_SITE_OTHER): Payer: Self-pay | Admitting: Family Medicine

## 2022-01-20 VITALS — BP 96/62 | HR 76 | Wt 158.2 lb

## 2022-01-20 DIAGNOSIS — Z3A21 21 weeks gestation of pregnancy: Secondary | ICD-10-CM

## 2022-01-20 DIAGNOSIS — O34219 Maternal care for unspecified type scar from previous cesarean delivery: Secondary | ICD-10-CM

## 2022-01-20 DIAGNOSIS — Z789 Other specified health status: Secondary | ICD-10-CM

## 2022-01-20 DIAGNOSIS — O0990 Supervision of high risk pregnancy, unspecified, unspecified trimester: Secondary | ICD-10-CM

## 2022-01-20 DIAGNOSIS — O09522 Supervision of elderly multigravida, second trimester: Secondary | ICD-10-CM

## 2022-01-20 MED ORDER — ASPIRIN 81 MG PO TBEC: 81.0000 mg | DELAYED_RELEASE_TABLET | Freq: Every day | ORAL | 12 refills | Status: DC

## 2022-01-20 NOTE — Progress Notes (Signed)
   Subjective:  Annette [name] is a 41 y.o. [id number] at 5w3dbeing seen today for ongoing prenatal care.  She is currently monitored for the following issues for this high-risk pregnancy and has Supervision of high risk pregnancy, antepartum; AMA (advanced maternal age) multigravida 377+ Language barrier; Hx successful VBAC (vaginal birth after cesarean), currently pregnant; Hx of cesarean section; and Not immune to rubella on their problem list.  Patient reports no complaints.  Contractions: Not present. Vag. Bleeding: None.  Movement: Present. Denies leaking of fluid.   The following portions of the patient's history were reviewed and updated as appropriate: allergies, current medications, past family history, past medical history, past social history, past surgical history and problem list. Problem list updated.  Objective:   Vitals:   01/20/22 0911  BP: 96/62  Pulse: 76  Weight: 158 lb 3.2 oz (71.8 kg)    Fetal Status: Fetal Heart Rate (bpm): 153   Movement: Present     General:  Alert, oriented and cooperative. Patient is in no acute distress.  Skin: Skin is warm and dry. No rash noted.   Cardiovascular: Normal heart rate noted  Respiratory: Normal respiratory effort, no problems with respiration noted  Abdomen: Soft, gravid, appropriate for gestational age. Pain/Pressure: Absent     Pelvic: Vag. Bleeding: None     Cervical exam deferred        Extremities: Normal range of motion.     Mental Status: Normal mood and affect. Normal behavior. Normal judgment and thought content.   Urinalysis:      Assessment and Plan:  Pregnancy: [id number] 262w3d1. Supervision of high risk pregnancy, antepartum BP and FHR normal Discussed contraception, she has used pills in the past but discussed with advancing age this is less ideal due to risk of thromboembolism Reviewed option of IUD, especially scholarship and post placental placement in the hospital Also discussed partner  vasectomy, she is skeptical he will do this She is still undecided, review again at next visit  2. Hx successful VBAC (vaginal birth after cesarean), currently pregnant Desires TOLAC, sign at 28 wks  3. Language barrier Spanish  4. Not immune to rubella Offer MMR PP  5. Multigravida of advanced maternal age in second trimester Recommended she start baby ASA, she is amenable Rx sent  Preterm labor symptoms and general obstetric precautions including but not limited to vaginal bleeding, contractions, leaking of fluid and fetal movement were reviewed in detail with the patient. Please refer to After Visit Summary for other counseling recommendations.  Return in 4 weeks (on 02/17/2022[facility]   EcClarnce FlockMD

## 2022-01-20 NOTE — Patient Instructions (Signed)
Eleccin del mtodo anticonceptivo Contraception Choices La anticoncepcin, o los mtodos anticonceptivos, hace referencia a los mtodos o dispositivos que evitan el embarazo. Mtodos hormonales  Implante anticonceptivo Un implante anticonceptivo consiste en un tubo delgado de plstico que contiene una hormona que evita el embarazo. Es diferente de un dispositivo intrauterino (DIU). Un mdico lo inserta en la parte superior del brazo. Los implantes pueden ser eficaces durante un mximo de 3 aos. Inyecciones de progestina sola Las inyecciones de progestina sola contienen progestina, una forma sinttica de la hormona progesterona. Un mdico las administra cada 3 meses. Pldoras anticonceptivas Las pldoras anticonceptivas son pastillas que contienen hormonas que evitan el embarazo. Deben tomarse una vez al da, preferentemente a la misma hora cada da. Se necesita una receta para utilizar este mtodo anticonceptivo. Parche anticonceptivo El parche anticonceptivo contiene hormonas que evitan el embarazo. Se coloca en la piel, debe cambiarse una vez a la semana durante tres semanas y debe retirarse en la cuarta semana. Se necesita una receta para utilizar este mtodo anticonceptivo. Anillo vaginal Un anillo vaginal contiene hormonas que evitan el embarazo. Se coloca en la vagina durante tres semanas y se retira en la cuarta semana. Luego se repite el proceso con un anillo nuevo. Se necesita una receta para utilizar este mtodo anticonceptivo. Anticonceptivo de emergencia Los anticonceptivos de emergencia son mtodos para evitar un embarazo despus de tener sexo sin proteccin. Vienen en forma de pldora y pueden tomarse hasta 5 das despus de tener sexo. Funcionan mejor cuando se toman lo ms pronto posible luego de tener sexo. La mayora de los anticonceptivos de emergencia estn disponibles sin receta mdica. Este mtodo no debe utilizarse como el nico mtodo anticonceptivo. Mtodos de  barrera  Condn masculino Un condn masculino es una vaina delgada que se coloca sobre el pene durante el sexo. Los condones evitan que el esperma ingrese en el cuerpo de la mujer. Pueden utilizarse con un una sustancia que mata a los espermatozoides (espermicida) para aumentar la efectividad. Deben desecharse despus de un uso. Condn femenino Un condn femenino es una vaina blanda y holgada que se coloca en la vagina antes de tener sexo. El condn evita que el esperma ingrese en el cuerpo de la mujer. Deben desecharse despus de un uso. Diafragma Un diafragma es una barrera blanda con forma de cpula. Se inserta en la vagina antes del sexo, junto con un espermicida. El diafragma bloquea el ingreso de esperma en el tero, y el espermicida mata a los espermatozoides. El diafragma debe permanecer en la vagina durante 6 a 8 horas despus de tener sexo y debe retirarse en el plazo de las 24 horas. Un diafragma es recetado y colocado por un mdico. Debe reemplazarse cada 1 a 2 aos, despus de dar a luz, de aumentar ms de 15lb (6.8kg) y de una ciruga plvica. Capuchn cervical Un capuchn cervical es una copa redonda y blanda de ltex o plstico que se coloca en el cuello uterino. Se inserta en la vagina antes del sexo, junto con un espermicida. Bloquea el ingreso del esperma en el tero. El capuchn debe permanecer en el lugar durante 6 a 8 horas despus de tener sexo y debe retirarse en el plazo de las 48 horas. Un capuchn cervical debe ser recetado y colocado por un mdico. Debe reemplazarse cada 2aos. Esponja Una esponja es una pieza blanda y circular de espuma de poliuretano que contiene espermicida. La esponja ayuda a bloquear el ingreso de esperma en el tero, y el espermicida   mata a los espermatozoides. Para utilizarla, debe humedecerla e insertarla en la vagina. Debe insertarse antes de tener sexo, debe permanecer dentro al menos durante 6 horas despus de tener sexo y debe retirarse y  desecharse en el plazo de las 30 horas. Espermicidas Los espermicidas son sustancias qumicas que matan o bloquean al esperma y no lo dejan ingresar al cuello uterino y al tero. Vienen en forma de crema, gel, supositorio, espuma o comprimido. Un espermicida debe insertarse en la vagina con un aplicador al menos 10 o 15 minutos antes de tener sexo para dar tiempo a que surta efecto. El proceso debe repetirse cada vez que tenga sexo. Los espermicidas no requieren receta mdica. Anticonceptivos intrauterinos Dispositivo intrauterino (DIU) Un DIU es un dispositivo en forma de T que se coloca en el tero. Existen dos tipos: DIU hormonal.Este tipo contiene progestina, una forma sinttica de la hormona progesterona. Este tipo puede permanecer colocado durante 3 a 5 aos. DIU de cobre.Este tipo est recubierto con un alambre de cobre. Puede permanecer colocado durante 10 aos. Mtodos anticonceptivos permanentes Ligadura de trompas en la mujer En este mtodo, se sellan, atan u obstruyen las trompas de Falopio durante una ciruga para evitar que el vulo descienda hacia el tero. Esterilizacin histeroscpica En este mtodo, se coloca un implante pequeo y flexible dentro de cada trompa de Falopio. Los implantes hacen que se forme un tejido cicatricial en las trompas de Falopio y que las obstruya para que el espermatozoide no pueda llegar al vulo. El procedimiento demora alrededor de 3 meses para que sea efectivo. Debe utilizarse otro mtodo anticonceptivo durante esos 3 meses. Esterilizacin masculina Este es un procedimiento que consiste en atar los conductos que transportan el esperma (vasectoma). Luego del procedimiento, el hombre puede eyacular lquido (semen). Debe utilizarse otro mtodo anticonceptivo durante 3 meses despus del procedimiento. Mtodos de planificacin natural Planificacin familiar natural En este mtodo, la pareja no tiene sexo durante los das en que la mujer podra quedar  embarazada. Mtodo calendario En este mtodo, la mujer realiza un seguimiento de la duracin de cada ciclo menstrual, identifica los das en los que se puede producir un embarazo y no tiene sexo durante esos das. Mtodo de la ovulacin En este mtodo, la pareja evita tener sexo durante la ovulacin. Mtodo sintotrmico Este mtodo implica no tener sexo durante la ovulacin. Normalmente, la mujer comprueba la ovulacin al observar cambios en su temperatura y en la consistencia del moco cervical. Mtodo posovulacin En este mtodo, la pareja espera a que finalice la ovulacin para tener sexo. Dnde buscar ms informacin Centers for Disease Control and Prevention (Centros para el Control y la Prevencin de Enfermedades): www.cdc.gov Resumen La anticoncepcin, o los mtodos anticonceptivos, hace referencia a los mtodos o dispositivos que evitan el embarazo. Los mtodos anticonceptivos hormonales incluyen implantes, inyecciones, pastillas, parches, anillos vaginales y anticonceptivos de emergencia. Los mtodos anticonceptivos de barrera pueden incluir condones masculinos, condones femeninos, diafragmas, capuchones cervicales, esponjas y espermicidas. Existen dos tipos de DIU (dispositivo intrauterino). Un DIU puede colocarse en el tero de una mujer para evitar el embarazo durante 3 a 5 aos. La esterilizacin permanente puede realizarse mediante un procedimiento tanto en los hombres como en las mujeres. Los mtodos de planificacin familiar natural implican no tener sexo durante los das en que la mujer podra quedar embarazada. Esta informacin no tiene como fin reemplazar el consejo del mdico. Asegrese de hacerle al mdico cualquier pregunta que tenga. Document Revised: 09/19/2019 Document Reviewed: 09/19/2019 Elsevier Patient Education    2023 Elsevier Inc.  

## 2022-02-12 ENCOUNTER — Other Ambulatory Visit: Payer: Self-pay

## 2022-02-12 ENCOUNTER — Ambulatory Visit (INDEPENDENT_AMBULATORY_CARE_PROVIDER_SITE_OTHER): Payer: Self-pay | Admitting: Obstetrics and Gynecology

## 2022-02-12 VITALS — BP 101/67 | HR 79 | Wt 162.3 lb

## 2022-02-12 DIAGNOSIS — Z98891 History of uterine scar from previous surgery: Secondary | ICD-10-CM

## 2022-02-12 DIAGNOSIS — O0990 Supervision of high risk pregnancy, unspecified, unspecified trimester: Secondary | ICD-10-CM

## 2022-02-12 DIAGNOSIS — Z3A24 24 weeks gestation of pregnancy: Secondary | ICD-10-CM

## 2022-02-12 DIAGNOSIS — Z789 Other specified health status: Secondary | ICD-10-CM

## 2022-02-12 DIAGNOSIS — O09522 Supervision of elderly multigravida, second trimester: Secondary | ICD-10-CM

## 2022-02-12 DIAGNOSIS — O34219 Maternal care for unspecified type scar from previous cesarean delivery: Secondary | ICD-10-CM

## 2022-02-12 DIAGNOSIS — O0992 Supervision of high risk pregnancy, unspecified, second trimester: Secondary | ICD-10-CM

## 2022-02-12 MED ORDER — ASPIRIN 81 MG PO TBEC: 81.0000 mg | DELAYED_RELEASE_TABLET | Freq: Every day | ORAL | 12 refills | Status: DC

## 2022-02-12 NOTE — Patient Instructions (Signed)

## 2022-02-12 NOTE — Progress Notes (Signed)
   PRENATAL VISIT NOTE  Subjective:  Annette [name] is a 41 y.o. [id number] at [redacted]w[redacted]d being seen today for ongoing prenatal care.  She is currently monitored for the following issues for this high-risk pregnancy and has Supervision of high risk pregnancy, antepartum; AMA (advanced maternal age) multigravida 35+; Language barrier; Hx successful VBAC (vaginal birth after cesarean), currently pregnant; Hx of cesarean section; and Not immune to rubella on their problem list.  Patient doing well with no acute concerns today. She reports  mild upper respiratory symptoms .  Contractions: Not present. Vag. Bleeding: None.  Movement: Present. Denies leaking of fluid.   The following portions of the patient's history were reviewed and updated as appropriate: allergies, current medications, past family history, past medical history, past social history, past surgical history and problem list. Problem list updated.  Objective:   Vitals:   02/12/22 0828  BP: 101/67  Pulse: 79  Weight: 162 lb 4.8 oz (73.6 kg)    Fetal Status: Fetal Heart Rate (bpm): 134 Fundal Height: 23 cm Movement: Present     General:  Alert, oriented and cooperative. Patient is in no acute distress.  Skin: Skin is warm and dry. No rash noted.   Cardiovascular: Normal heart rate noted  Respiratory: Normal respiratory effort, no problems with respiration noted  Abdomen: Soft, gravid, appropriate for gestational age.  Pain/Pressure: Absent     Pelvic: Cervical exam deferred        Extremities: Normal range of motion.  Edema: None  Mental Status:  Normal mood and affect. Normal behavior. Normal judgment and thought content.   Assessment and Plan:  Pregnancy: [id number] at [redacted]w[redacted]d  1. Supervision of high risk pregnancy, antepartum Continue routine prenatal care, 2 hour GTT next visit  2. [redacted] weeks gestation of pregnancy   3. Language barrier Tele interpreter utilized  4. Hx successful VBAC (vaginal birth after cesarean),  currently pregnant Pt desires VBAC, sign consent at next visit  5. Hx of cesarean section   6. Multigravida of advanced maternal age in second trimester Pt has growth and follow up anatomy scan on 02/16/22  - aspirin EC 81 MG tablet; Take 1 tablet (81 mg total) by mouth daily. Swallow whole.  Dispense: 30 tablet; Refill: 12  Preterm labor symptoms and general obstetric precautions including but not limited to vaginal bleeding, contractions, leaking of fluid and fetal movement were reviewed in detail with the patient.  Please refer to After Visit Summary for other counseling recommendations.   Return in about 3 weeks (around 03/05/2022) for [facility], in person, 2 hr GTT, 3rd trim labs.   Mariel Aloe, MD Faculty Attending [facility]

## 2022-02-16 ENCOUNTER — Other Ambulatory Visit: Payer: Self-pay | Admitting: *Deleted

## 2022-02-16 ENCOUNTER — Ambulatory Visit: Payer: Self-pay | Attending: Maternal & Fetal Medicine

## 2022-02-16 ENCOUNTER — Ambulatory Visit: Payer: Self-pay | Admitting: *Deleted

## 2022-02-16 VITALS — BP 106/55 | HR 79

## 2022-02-16 DIAGNOSIS — O0990 Supervision of high risk pregnancy, unspecified, unspecified trimester: Secondary | ICD-10-CM

## 2022-02-16 DIAGNOSIS — O09522 Supervision of elderly multigravida, second trimester: Secondary | ICD-10-CM | POA: Insufficient documentation

## 2022-02-16 DIAGNOSIS — Z362 Encounter for other antenatal screening follow-up: Secondary | ICD-10-CM

## 2022-02-16 DIAGNOSIS — O09523 Supervision of elderly multigravida, third trimester: Secondary | ICD-10-CM | POA: Insufficient documentation

## 2022-02-16 DIAGNOSIS — Z789 Other specified health status: Secondary | ICD-10-CM | POA: Insufficient documentation

## 2022-02-16 DIAGNOSIS — O34219 Maternal care for unspecified type scar from previous cesarean delivery: Secondary | ICD-10-CM | POA: Insufficient documentation

## 2022-02-16 DIAGNOSIS — Z3A25 25 weeks gestation of pregnancy: Secondary | ICD-10-CM

## 2022-03-09 ENCOUNTER — Other Ambulatory Visit: Payer: Self-pay

## 2022-03-09 ENCOUNTER — Ambulatory Visit: Payer: Self-pay

## 2022-03-09 ENCOUNTER — Ambulatory Visit (INDEPENDENT_AMBULATORY_CARE_PROVIDER_SITE_OTHER): Payer: Self-pay | Admitting: Obstetrics and Gynecology

## 2022-03-09 VITALS — BP 102/68 | HR 76 | Wt 163.7 lb

## 2022-03-09 DIAGNOSIS — Z23 Encounter for immunization: Secondary | ICD-10-CM

## 2022-03-09 DIAGNOSIS — O34219 Maternal care for unspecified type scar from previous cesarean delivery: Secondary | ICD-10-CM

## 2022-03-09 DIAGNOSIS — Z98891 History of uterine scar from previous surgery: Secondary | ICD-10-CM

## 2022-03-09 DIAGNOSIS — Z3A28 28 weeks gestation of pregnancy: Secondary | ICD-10-CM

## 2022-03-09 DIAGNOSIS — O09523 Supervision of elderly multigravida, third trimester: Secondary | ICD-10-CM

## 2022-03-09 DIAGNOSIS — O0990 Supervision of high risk pregnancy, unspecified, unspecified trimester: Secondary | ICD-10-CM

## 2022-03-09 DIAGNOSIS — Z789 Other specified health status: Secondary | ICD-10-CM

## 2022-03-09 NOTE — Progress Notes (Signed)
   PRENATAL VISIT NOTE  Subjective:  Annette [name] is a 42 y.o. G5P3013 at [redacted]w[redacted]d being seen today for ongoing prenatal care.  She is currently monitored for the following issues for this high-risk pregnancy and has Supervision of high risk pregnancy, antepartum; AMA (advanced maternal age) multigravida 72+; Language barrier; Hx successful VBAC (vaginal birth after cesarean), currently pregnant; Hx of cesarean section; and Not immune to rubella on their problem list.  Patient doing well with no acute concerns today. She reports no complaints.  Contractions: Not present. Vag. Bleeding: None.  Movement: Present. Denies leaking of fluid.   The following portions of the patient's history were reviewed and updated as appropriate: allergies, current medications, past family history, past medical history, past social history, past surgical history and problem list. Problem list updated.  Objective:   Vitals:   03/09/22 0855  BP: 102/68  Pulse: 76  Weight: 163 lb 11.2 oz (74.3 kg)    Fetal Status: Fetal Heart Rate (bpm): 138 Fundal Height: 29 cm Movement: Present     General:  Alert, oriented and cooperative. Patient is in no acute distress.  Skin: Skin is warm and dry. No rash noted.   Cardiovascular: Normal heart rate noted  Respiratory: Normal respiratory effort, no problems with respiration noted  Abdomen: Soft, gravid, appropriate for gestational age.  Pain/Pressure: Absent     Pelvic: Cervical exam deferred        Extremities: Normal range of motion.  Edema: None  Mental Status:  Normal mood and affect. Normal behavior. Normal judgment and thought content.   Assessment and Plan:  Pregnancy: [id number] at [redacted]w[redacted]d  1. Supervision of high risk pregnancy, antepartum Continue routine prenatal care Pt is considering BTL, but only has financial assistance, pt advised to speak with check out regarding up front cost. Pt advised she could go to [facility] and receive IUD or nexplanon at reduced  cost.  - Tdap vaccine greater than or equal to 7yo IM  2. [redacted] weeks gestation of pregnancy   3. Language barrier Live interpreter present  4. Not immune to rubella Treat after delivery  5. Hx successful VBAC (vaginal birth after cesarean), currently pregnant Consent form signed, pt has hx pf VBAC x 2  6. Hx of cesarean section   7. Multigravida of advanced maternal age in third trimester Growth scan 04/06/22  Preterm labor symptoms and general obstetric precautions including but not limited to vaginal bleeding, contractions, leaking of fluid and fetal movement were reviewed in detail with the patient.  Please refer to After Visit Summary for other counseling recommendations.   Return in about 2 weeks (around 03/23/2022) for [facility], in person.   Lynnda Shields, MD Faculty Attending Center for [facility]

## 2022-03-10 LAB — CBC
Hematocrit: 36.1 % (ref 34.0–46.6)
Hemoglobin: 12.1 g/dL (ref 11.1–15.9)
MCH: 30.4 pg (ref 26.6–33.0)
MCHC: 33.5 g/dL (ref 31.5–35.7)
MCV: 91 fL (ref 79–97)
Platelets: 226 10*3/uL (ref 150–450)
RBC: 3.98 x10E6/uL (ref 3.77–5.28)
RDW: 13.1 % (ref 11.7–15.4)
WBC: 12.9 10*3/uL — ABNORMAL HIGH (ref 3.4–10.8)

## 2022-03-10 LAB — HIV ANTIBODY (ROUTINE TESTING W REFLEX): HIV Screen 4th Generation wRfx: NONREACTIVE

## 2022-03-10 LAB — GLUCOSE TOLERANCE, 2 HOURS W/ 1HR
Glucose, 1 hour: 220 mg/dL — ABNORMAL HIGH (ref 70–179)
Glucose, 2 hour: 232 mg/dL — ABNORMAL HIGH (ref 70–152)
Glucose, Fasting: 93 mg/dL — ABNORMAL HIGH (ref 70–91)

## 2022-03-10 LAB — RPR: RPR Ser Ql: NONREACTIVE

## 2022-03-11 ENCOUNTER — Telehealth: Payer: Self-pay | Admitting: General Practice

## 2022-03-11 DIAGNOSIS — O24410 Gestational diabetes mellitus in pregnancy, diet controlled: Secondary | ICD-10-CM

## 2022-03-11 NOTE — Telephone Encounter (Signed)
Called patient with Annette [name] assisting with Spanish interpretation & informed her of results. Scheduled appt with diabetes education for 1/18 @ 815. Patient verbalized understanding to all.

## 2022-03-11 NOTE — Telephone Encounter (Signed)
-----   Message from [name], MD sent at 03/10/2022  2:42 PM EST ----- Third trimester labs normal, 2 hour GTT severely elevated, pt need diabetic teaching ASAP

## 2022-03-19 ENCOUNTER — Other Ambulatory Visit: Payer: Self-pay

## 2022-03-19 ENCOUNTER — Encounter: Payer: Self-pay | Attending: Obstetrics and Gynecology | Admitting: Registered"

## 2022-03-19 ENCOUNTER — Ambulatory Visit (INDEPENDENT_AMBULATORY_CARE_PROVIDER_SITE_OTHER): Payer: Self-pay | Admitting: Registered"

## 2022-03-19 DIAGNOSIS — Z3A29 29 weeks gestation of pregnancy: Secondary | ICD-10-CM | POA: Insufficient documentation

## 2022-03-19 DIAGNOSIS — O24419 Gestational diabetes mellitus in pregnancy, unspecified control: Secondary | ICD-10-CM

## 2022-03-19 DIAGNOSIS — Z713 Dietary counseling and surveillance: Secondary | ICD-10-CM | POA: Insufficient documentation

## 2022-03-19 DIAGNOSIS — O24410 Gestational diabetes mellitus in pregnancy, diet controlled: Secondary | ICD-10-CM | POA: Insufficient documentation

## 2022-03-19 NOTE — Progress Notes (Signed)
Patient was seen for Gestational Diabetes self-management on 03/19/22  Start time 0817 and End time 0914   Estimated due date: 05/30/22; [redacted]w[redacted]d  AMN video [place] # [id number]  Clinical: Medications: aspirin, prenatal Medical History: reviewed Labs: OGTT all 3 elevated 93-220-232, A1c 5.2%  on 11/26/21  Dietary and Lifestyle History: Pt reports no prior history of diabetes. Pt states she was eating a lot of sweets earlier in the pregnancy.   Pt states she does not like most sweetened beverages. Likes coffee somewhat, but doesn't drink it often. Pt states she drinks mostly water  Physical Activity: ADL Stress: not assessed Sleep: not assessed  24 hr Recall: not assessed First Meal:  Snack: Second meal: Snack: Third meal: Snack: Beverages:  NUTRITION INTERVENTION  Nutrition education (E-1) on the following topics:   Initial Follow-up  []  []  Definition of Gestational Diabetes []  []  Why dietary management is important in controlling blood glucose []  []  Effects each nutrient has on blood glucose levels []  []  Simple carbohydrates vs complex carbohydrates []  []  Fluid intake [x]  []  Creating a balanced meal plan [x]  []  Carbohydrate counting  [x]  []  When to check blood glucose levels [x]  []  Proper blood glucose monitoring techniques [x]  []  Effect of stress and stress reduction techniques  [x]  []  Exercise effect on blood glucose levels, appropriate exercise during pregnancy []  []  Importance of limiting caffeine and abstaining from alcohol and smoking []  []  Medications used for blood sugar control during pregnancy []  []  Hypoglycemia and rule of 15 []  []  Postpartum self care  Blood glucose monitor given: Prodig[employer]  Lot # [id number] Lot #[id number] B-4 strips Exp: 2022-10-26 strips CBG: 107 mg/dL  Patient instructed to monitor glucose levels: FBS: 60 - ? 95 mg/dL; 2 hour: ? 120 mg/dL  Patient received handouts: Spanish Nutrition Diabetes and Pregnancy Carbohydrate  Counting List  Patient will be seen for follow-up as needed.

## 2022-03-26 ENCOUNTER — Encounter: Payer: Self-pay | Attending: Obstetrics and Gynecology | Admitting: Registered"

## 2022-03-26 ENCOUNTER — Other Ambulatory Visit: Payer: Self-pay

## 2022-03-26 ENCOUNTER — Ambulatory Visit (INDEPENDENT_AMBULATORY_CARE_PROVIDER_SITE_OTHER): Payer: Self-pay | Admitting: Obstetrics & Gynecology

## 2022-03-26 ENCOUNTER — Ambulatory Visit (INDEPENDENT_AMBULATORY_CARE_PROVIDER_SITE_OTHER): Payer: Self-pay | Admitting: Registered"

## 2022-03-26 VITALS — BP 104/69 | HR 78 | Wt 166.0 lb

## 2022-03-26 DIAGNOSIS — O0990 Supervision of high risk pregnancy, unspecified, unspecified trimester: Secondary | ICD-10-CM

## 2022-03-26 DIAGNOSIS — Z98891 History of uterine scar from previous surgery: Secondary | ICD-10-CM

## 2022-03-26 DIAGNOSIS — O0993 Supervision of high risk pregnancy, unspecified, third trimester: Secondary | ICD-10-CM

## 2022-03-26 DIAGNOSIS — Z3A30 30 weeks gestation of pregnancy: Secondary | ICD-10-CM

## 2022-03-26 DIAGNOSIS — O34219 Maternal care for unspecified type scar from previous cesarean delivery: Secondary | ICD-10-CM

## 2022-03-26 DIAGNOSIS — Z3A29 29 weeks gestation of pregnancy: Secondary | ICD-10-CM | POA: Insufficient documentation

## 2022-03-26 DIAGNOSIS — O24410 Gestational diabetes mellitus in pregnancy, diet controlled: Secondary | ICD-10-CM | POA: Insufficient documentation

## 2022-03-26 DIAGNOSIS — Z713 Dietary counseling and surveillance: Secondary | ICD-10-CM | POA: Insufficient documentation

## 2022-03-26 DIAGNOSIS — O24419 Gestational diabetes mellitus in pregnancy, unspecified control: Secondary | ICD-10-CM

## 2022-03-26 MED ORDER — DOCUSATE SODIUM 100 MG PO CAPS: 100.0000 mg | ORAL_CAPSULE | Freq: Two times a day (BID) | ORAL | 0 refills | Status: DC

## 2022-03-26 NOTE — Progress Notes (Signed)
   PRENATAL VISIT NOTE  Subjective:  Annette [name] is a 42 y.o. [id number] at [redacted]w[redacted]d being seen today for ongoing prenatal care.  She is currently monitored for the following issues for this high-risk pregnancy and has Supervision of high risk pregnancy, antepartum; AMA (advanced maternal age) multigravida 55+; Language barrier; Hx successful VBAC (vaginal birth after cesarean), currently pregnant; Hx of cesarean section; Not immune to rubella; and Gestational diabetes mellitus (GDM), antepartum on their problem list.  Patient reports  constipation .  Contractions: Not present. Vag. Bleeding: None.  Movement: Present. Denies leaking of fluid.   The following portions of the patient's history were reviewed and updated as appropriate: allergies, current medications, past family history, past medical history, past social history, past surgical history and problem list.   Objective:   Vitals:   03/26/22 0838  BP: 104/69  Pulse: 78  Weight: 166 lb (75.3 kg)    Fetal Status: Fetal Heart Rate (bpm): 145   Movement: Present     General:  Alert, oriented and cooperative. Patient is in no acute distress.  Skin: Skin is warm and dry. No rash noted.   Cardiovascular: Normal heart rate noted  Respiratory: Normal respiratory effort, no problems with respiration noted  Abdomen: Soft, gravid, appropriate for gestational age.  Pain/Pressure: Absent     Pelvic: Cervical exam deferred        Extremities: Normal range of motion.  Edema: None  Mental Status: Normal mood and affect. Normal behavior. Normal judgment and thought content.   Assessment and Plan:  Pregnancy: [id number] at [redacted]w[redacted]d 1. Supervision of high risk pregnancy, antepartum constipation - docusate sodium (COLACE) 100 MG capsule; Take 1 capsule (100 mg total) by mouth 2 (two) times daily.  Dispense: 10 capsule; Refill: 0  2. Hx of cesarean section Plans TOLAC  3. Hx successful VBAC (vaginal birth after cesarean), currently  pregnant   Preterm labor symptoms and general obstetric precautions including but not limited to vaginal bleeding, contractions, leaking of fluid and fetal movement were reviewed in detail with the patient. Please refer to After Visit Summary for other counseling recommendations.   Return in about 2 weeks (around 04/09/2022).  Future Appointments  Date Time Provider [place]  03/26/2022  9:15 AM [facility]  04/06/2022  8:15 AM [facility] NURSE [facility]  04/06/2022  8:30 AM [facility] US2 [facility]    Emeterio Reeve, MD

## 2022-03-26 NOTE — Progress Notes (Signed)
Patient was seen for Gestational Diabetes self-management on 03/19/22  Start time 4193 and End time 1000   Estimated due date: 05/30/22; [redacted]w[redacted]d  AMN video Interpreter Effie Shy #[id number]    Pt has been checking blood sugar x1 week FBS: 66-97 mg/dL 5/7 WNL PPBG: breakfast WNL; Lunch 6/7 WNL; Dinner 4/7 WNL   Clinical: Medications: aspirin, prenatal Medical History: reviewed Labs: OGTT all 3 elevated 93-220-232, A1c 5.2%  on 11/26/21  Dietary and Lifestyle History: Pt report was eating [employer] vanilla yogurt, berries, and granola but wasn't sure it was good so she stopped. Pt reports drinking 2 glasses of milk per day. If patient starts including yogurt again will be getting recommended amount of dairy.   Pt appears to be doing well controlling blood sugar with diet , mostly just dinner numbers are the issue, but dinners are appropriate. Pt states she thinks her blood sugar has been high due to death in the family. Pt states she will have to call her nephews today to tell them about the death and feels a lot of stress today.  Physical Activity: ADL Stress: not assessed Sleep: not assessed  24 hr Recall: not assessed First Meal: oatmeal, nuts, milk Snack:none Second meal: sopa, meat, lettuce, tomato, avocado Snack: apple, nuts Third meal: grilled chicken, nopales Snack: none Beverages: water 5-6 bottles, 8 oz milk 2x/day  NUTRITION INTERVENTION  Nutrition education (E-1) on the following topics:   Initial Follow-up  [x]  [x]  Definition of Gestational Diabetes [x]  []  Why dietary management is important in controlling blood glucose []  [x]  Effects each nutrient has on blood glucose levels []  []  Simple carbohydrates vs complex carbohydrates []  [x]  Fluid intake [x]  []  Creating a balanced meal plan [x]  []  Carbohydrate counting  [x]  []  When to check blood glucose levels [x]  []  Proper blood glucose monitoring techniques [x]  []  Effect of stress and stress reduction techniques  [x]  []  Exercise  effect on blood glucose levels, appropriate exercise during pregnancy []  []  Importance of limiting caffeine and abstaining from alcohol and smoking []  []  Medications used for blood sugar control during pregnancy []  []  Hypoglycemia and rule of 15 []  [x]  Postpartum self care  Patient instructed to monitor glucose levels: FBS: 60 - ? 95 mg/dL; 2 hour: ? 120 mg/dL  Patient received handouts: Spanish Nutrition Diabetes and Pregnancy Carbohydrate Counting List  Patient will be seen for follow-up as needed.

## 2022-04-06 ENCOUNTER — Ambulatory Visit: Payer: Self-pay | Admitting: *Deleted

## 2022-04-06 ENCOUNTER — Ambulatory Visit: Payer: Self-pay | Attending: Obstetrics and Gynecology

## 2022-04-06 ENCOUNTER — Other Ambulatory Visit: Payer: Self-pay | Admitting: *Deleted

## 2022-04-06 ENCOUNTER — Other Ambulatory Visit: Payer: Self-pay | Admitting: Obstetrics and Gynecology

## 2022-04-06 VITALS — BP 106/53 | HR 72

## 2022-04-06 DIAGNOSIS — Z3A32 32 weeks gestation of pregnancy: Secondary | ICD-10-CM

## 2022-04-06 DIAGNOSIS — O0990 Supervision of high risk pregnancy, unspecified, unspecified trimester: Secondary | ICD-10-CM | POA: Insufficient documentation

## 2022-04-06 DIAGNOSIS — O09523 Supervision of elderly multigravida, third trimester: Secondary | ICD-10-CM

## 2022-04-06 DIAGNOSIS — O34219 Maternal care for unspecified type scar from previous cesarean delivery: Secondary | ICD-10-CM

## 2022-04-06 DIAGNOSIS — Z789 Other specified health status: Secondary | ICD-10-CM

## 2022-04-06 DIAGNOSIS — O24419 Gestational diabetes mellitus in pregnancy, unspecified control: Secondary | ICD-10-CM

## 2022-04-06 DIAGNOSIS — Z2839 Other underimmunization status: Secondary | ICD-10-CM

## 2022-04-13 ENCOUNTER — Encounter: Payer: Self-pay | Admitting: *Deleted

## 2022-04-13 ENCOUNTER — Encounter: Payer: Self-pay | Admitting: Obstetrics and Gynecology

## 2022-04-13 ENCOUNTER — Ambulatory Visit: Payer: Self-pay | Admitting: *Deleted

## 2022-04-13 ENCOUNTER — Ambulatory Visit: Payer: Self-pay | Attending: Obstetrics

## 2022-04-13 ENCOUNTER — Ambulatory Visit (INDEPENDENT_AMBULATORY_CARE_PROVIDER_SITE_OTHER): Payer: Self-pay | Admitting: Obstetrics and Gynecology

## 2022-04-13 ENCOUNTER — Other Ambulatory Visit: Payer: Self-pay

## 2022-04-13 VITALS — BP 107/70 | HR 120 | Wt 168.9 lb

## 2022-04-13 VITALS — BP 104/58 | HR 77

## 2022-04-13 DIAGNOSIS — Z789 Other specified health status: Secondary | ICD-10-CM

## 2022-04-13 DIAGNOSIS — Z3A33 33 weeks gestation of pregnancy: Secondary | ICD-10-CM

## 2022-04-13 DIAGNOSIS — O24419 Gestational diabetes mellitus in pregnancy, unspecified control: Secondary | ICD-10-CM | POA: Insufficient documentation

## 2022-04-13 DIAGNOSIS — O24410 Gestational diabetes mellitus in pregnancy, diet controlled: Secondary | ICD-10-CM

## 2022-04-13 DIAGNOSIS — O34219 Maternal care for unspecified type scar from previous cesarean delivery: Secondary | ICD-10-CM

## 2022-04-13 DIAGNOSIS — O0990 Supervision of high risk pregnancy, unspecified, unspecified trimester: Secondary | ICD-10-CM

## 2022-04-13 DIAGNOSIS — L600 Ingrowing nail: Secondary | ICD-10-CM

## 2022-04-13 DIAGNOSIS — Z2839 Other underimmunization status: Secondary | ICD-10-CM

## 2022-04-13 DIAGNOSIS — O09523 Supervision of elderly multigravida, third trimester: Secondary | ICD-10-CM

## 2022-04-13 NOTE — Progress Notes (Signed)
   PRENATAL VISIT NOTE  Subjective:  Annette [name] is a 42 y.o. [id number] at [redacted]w[redacted]d being seen today for ongoing prenatal care.  She is currently monitored for the following issues for this high-risk pregnancy and has Supervision of high risk pregnancy, antepartum; AMA (advanced maternal age) multigravida 24+; Language barrier; Hx successful VBAC (vaginal birth after cesarean), currently pregnant; Hx of cesarean section; Not immune to rubella; Gestational diabetes mellitus (GDM), antepartum; and Ingrown right big toenail on their problem list.  Patient reports  right big toe nail discomfort for several months .  Contractions: Irritability. Vag. Bleeding: None.  Movement: Present. Denies leaking of fluid.   The following portions of the patient's history were reviewed and updated as appropriate: allergies, current medications, past family history, past medical history, past social history, past surgical history and problem list.   Objective:   Vitals:   04/13/22 1502  BP: 107/70  Pulse: (!) 120  Weight: 168 lb 14.4 oz (76.6 kg)    Fetal Status: Fetal Heart Rate (bpm): 135   Movement: Present     General:  Alert, oriented and cooperative. Patient is in no acute distress.  Skin: Skin is warm and dry. No rash noted.   Cardiovascular: Normal heart rate noted  Respiratory: Normal respiratory effort, no problems with respiration noted  Abdomen: Soft, gravid, appropriate for gestational age.  Pain/Pressure: Present     Pelvic: Cervical exam deferred        Extremities: Normal range of motion.  Edema: None  Mental Status: Normal mood and affect. Normal behavior. Normal judgment and thought content.   Assessment and Plan:  Pregnancy: [id number] at [redacted]w[redacted]d 1. Ingrown right big toenail OTC measures. Follow up next week  2. Multigravida of advanced maternal age in third trimester Declined genetics. Afp and mfm anatomy u/s neg  3. Diet controlled gestational diabetes mellitus (GDM),  antepartum Normal AM fasting and 2 hours post prandial breakfast. A few in the 120s for after lunch and a few in the 120s and low 130s after dinner. Strategies d/w her and f/u next visit. D/w her that may have to start meds at that visit.  Continue qwk mfm ap testing later today  4. Hx successful VBAC (vaginal birth after cesarean), currently pregnant X 2. Tolac consent signed 1/8  5. Language barrier In person interpreter used  6. 33 weeks pregnancy Patient is self pay.  2/5: 56%, 2052gm, af 92%, afi 9.8, bpp 8/8, cephalic   Preterm labor symptoms and general obstetric precautions including but not limited to vaginal bleeding, contractions, leaking of fluid and fetal movement were reviewed in detail with the patient. Please refer to After Visit Summary for other counseling recommendations.   Return in about 1 week (around 04/20/2022) for in person, high risk ob.  Future Appointments  Date Time Provider [place]  04/20/2022  9:30 AM [facility] NURSE [facility]  04/20/2022  9:45 AM [facility] NST [facility]  04/27/2022  8:15 AM [facility] NURSE [facility]  04/27/2022  8:30 AM [facility] US2 [facility]  04/29/2022  4:15 PM Aletha Halim, MD [facility]  05/04/2022  8:30 AM [facility] NURSE [facility]  05/04/2022  8:45 AM [facility] US5 [facility]  05/11/2022  8:15 AM [facility] NURSE [facility]  05/11/2022  8:30 AM [facility] US2 [facility]    Aletha Halim, MD

## 2022-04-20 ENCOUNTER — Ambulatory Visit: Payer: Self-pay | Attending: Obstetrics | Admitting: *Deleted

## 2022-04-20 ENCOUNTER — Ambulatory Visit (HOSPITAL_BASED_OUTPATIENT_CLINIC_OR_DEPARTMENT_OTHER): Payer: Self-pay | Admitting: *Deleted

## 2022-04-20 VITALS — BP 104/57 | HR 73

## 2022-04-20 DIAGNOSIS — O24410 Gestational diabetes mellitus in pregnancy, diet controlled: Secondary | ICD-10-CM

## 2022-04-20 DIAGNOSIS — O09523 Supervision of elderly multigravida, third trimester: Secondary | ICD-10-CM

## 2022-04-20 DIAGNOSIS — Z789 Other specified health status: Secondary | ICD-10-CM

## 2022-04-20 DIAGNOSIS — Z3A34 34 weeks gestation of pregnancy: Secondary | ICD-10-CM

## 2022-04-20 DIAGNOSIS — O0990 Supervision of high risk pregnancy, unspecified, unspecified trimester: Secondary | ICD-10-CM

## 2022-04-20 NOTE — Procedures (Signed)
Annette [name] 1980/05/04 104w2d Fetus A Non-Stress Test Interpretation for 04/20/22  Indication: Advanced Maternal Age >40 years and GDM-diet  Fetal Heart Rate A Mode: External Baseline Rate (A): 130 bpm Variability: Moderate Accelerations: 15 x 15 Decelerations: None Multiple birth?: No  Uterine Activity Mode: Palpation, Toco Contraction Frequency (min): 1 UC Contraction Quality: Mild Resting Time: Adequate  Interpretation (Fetal Testing) Nonstress Test Interpretation: Reactive Comments: Dr. FAnnamaria Bootsreviewed tracing.

## 2022-04-22 ENCOUNTER — Ambulatory Visit (INDEPENDENT_AMBULATORY_CARE_PROVIDER_SITE_OTHER): Payer: Self-pay | Admitting: Family Medicine

## 2022-04-22 ENCOUNTER — Other Ambulatory Visit: Payer: Self-pay

## 2022-04-22 VITALS — BP 101/62 | HR 68 | Wt 169.1 lb

## 2022-04-22 DIAGNOSIS — O0990 Supervision of high risk pregnancy, unspecified, unspecified trimester: Secondary | ICD-10-CM

## 2022-04-22 DIAGNOSIS — O34219 Maternal care for unspecified type scar from previous cesarean delivery: Secondary | ICD-10-CM

## 2022-04-22 DIAGNOSIS — Z98891 History of uterine scar from previous surgery: Secondary | ICD-10-CM

## 2022-04-22 DIAGNOSIS — Z3A34 34 weeks gestation of pregnancy: Secondary | ICD-10-CM

## 2022-04-22 DIAGNOSIS — O09523 Supervision of elderly multigravida, third trimester: Secondary | ICD-10-CM

## 2022-04-22 DIAGNOSIS — O24410 Gestational diabetes mellitus in pregnancy, diet controlled: Secondary | ICD-10-CM

## 2022-04-22 DIAGNOSIS — O0993 Supervision of high risk pregnancy, unspecified, third trimester: Secondary | ICD-10-CM

## 2022-04-22 NOTE — Progress Notes (Signed)
   PRENATAL VISIT NOTE  Subjective:  Annette [name] is a 42 y.o. [id number] at 34w4dbeing seen today for ongoing prenatal care.  She is currently monitored for the following issues for this high-risk pregnancy and has Supervision of high risk pregnancy, antepartum; AMA (advanced maternal age) multigravida 349+ Language barrier; Hx successful VBAC (vaginal birth after cesarean), currently pregnant; Hx of cesarean section; Not immune to rubella; Gestational diabetes mellitus (GDM), antepartum; and Ingrown right big toenail on their problem list.  Patient reports no complaints.  Contractions: Not present. Vag. Bleeding: None.  Movement: Present. Denies leaking of fluid.   The following portions of the patient's history were reviewed and updated as appropriate: allergies, current medications, past family history, past medical history, past social history, past surgical history and problem list.   Objective:   Vitals:   04/22/22 1120  BP: 101/62  Pulse: 68  Weight: 169 lb 1.6 oz (76.7 kg)    Fetal Status: Fetal Heart Rate (bpm): 134   Movement: Present     General:  Alert, oriented and cooperative. Patient is in no acute distress.  Skin: Skin is warm and dry. No rash noted.   Cardiovascular: Normal heart rate noted  Respiratory: Normal respiratory effort, no problems with respiration noted  Abdomen: Soft, gravid, appropriate for gestational age.  Pain/Pressure: Absent     Pelvic: Cervical exam deferred        Extremities: Normal range of motion.     Mental Status: Normal mood and affect. Normal behavior. Normal judgment and thought content.   Assessment and Plan:  Pregnancy: [id number] 383w4d. Diet controlled gestational diabetes mellitus (GDM), antepartum BG Log reviewed WNL, doing well with diet Has USKoreacheduled Last EFW=2052g 56th% @32$  weeks, AC was 92nd%  2. Hx of cesarean section Desires VBAC, has had successful VBAC x2  3. Multigravida of advanced maternal age in third  trimester  4. Hx successful VBAC (vaginal birth after cesarean), currently pregnant Desires VBAC  5. Supervision of high risk pregnancy, antepartum Uo to date, no concerns  Preterm labor symptoms and general obstetric precautions including but not limited to vaginal bleeding, contractions, leaking of fluid and fetal movement were reviewed in detail with the patient. Please refer to After Visit Summary for other counseling recommendations.   Return in about 2 weeks (around 05/06/2022) for Routine prenatal care.  Future Appointments  Date Time Provider [place]/26/2024  8:15 AM [facility]URSE [facility]/26/2024  8:30 AM [facility]2/28/2024  4:15 PM PiAletha HalimMD [facility]3/06/2022  8:30 AM [facility] NURSE [facility]3/06/2022  8:45 AM [facility] US5 [facility]3/01/2023  8:15 AM [facility] NURSE [facility]3/01/2023  8:30 AM [facility] US2 [facility]-MFCUS [place]Wa[place]  KiCaren MacadamMD

## 2022-04-27 ENCOUNTER — Ambulatory Visit: Payer: Self-pay | Attending: Obstetrics

## 2022-04-27 ENCOUNTER — Ambulatory Visit: Payer: Self-pay | Admitting: *Deleted

## 2022-04-27 VITALS — BP 111/62 | HR 72

## 2022-04-27 DIAGNOSIS — Z3A35 35 weeks gestation of pregnancy: Secondary | ICD-10-CM

## 2022-04-27 DIAGNOSIS — O34219 Maternal care for unspecified type scar from previous cesarean delivery: Secondary | ICD-10-CM

## 2022-04-27 DIAGNOSIS — O0990 Supervision of high risk pregnancy, unspecified, unspecified trimester: Secondary | ICD-10-CM | POA: Insufficient documentation

## 2022-04-27 DIAGNOSIS — Z2839 Other underimmunization status: Secondary | ICD-10-CM

## 2022-04-27 DIAGNOSIS — O24410 Gestational diabetes mellitus in pregnancy, diet controlled: Secondary | ICD-10-CM

## 2022-04-27 DIAGNOSIS — Z789 Other specified health status: Secondary | ICD-10-CM

## 2022-04-27 DIAGNOSIS — O24419 Gestational diabetes mellitus in pregnancy, unspecified control: Secondary | ICD-10-CM | POA: Insufficient documentation

## 2022-04-27 DIAGNOSIS — O09523 Supervision of elderly multigravida, third trimester: Secondary | ICD-10-CM

## 2022-04-29 ENCOUNTER — Ambulatory Visit (INDEPENDENT_AMBULATORY_CARE_PROVIDER_SITE_OTHER): Payer: Self-pay | Admitting: Obstetrics and Gynecology

## 2022-04-29 ENCOUNTER — Other Ambulatory Visit: Payer: Self-pay

## 2022-04-29 VITALS — BP 106/67 | HR 77 | Wt 171.0 lb

## 2022-04-29 DIAGNOSIS — Z789 Other specified health status: Secondary | ICD-10-CM

## 2022-04-29 DIAGNOSIS — O0990 Supervision of high risk pregnancy, unspecified, unspecified trimester: Secondary | ICD-10-CM

## 2022-04-29 DIAGNOSIS — O09523 Supervision of elderly multigravida, third trimester: Secondary | ICD-10-CM

## 2022-04-29 DIAGNOSIS — O24410 Gestational diabetes mellitus in pregnancy, diet controlled: Secondary | ICD-10-CM

## 2022-04-29 DIAGNOSIS — Z3A35 35 weeks gestation of pregnancy: Secondary | ICD-10-CM

## 2022-04-29 DIAGNOSIS — O0993 Supervision of high risk pregnancy, unspecified, third trimester: Secondary | ICD-10-CM

## 2022-04-29 DIAGNOSIS — O34219 Maternal care for unspecified type scar from previous cesarean delivery: Secondary | ICD-10-CM

## 2022-04-30 NOTE — Progress Notes (Addendum)
   PRENATAL VISIT NOTE  Subjective:  Annette [name] is a 42 y.o. [id number] at 83w4dbeing seen today for ongoing prenatal care.  She is currently monitored for the following issues for this high-risk pregnancy and has Supervision of high risk pregnancy, antepartum; AMA (advanced maternal age) multigravida 322+ Language barrier; Hx successful VBAC (vaginal birth after cesarean), currently pregnant; Hx of cesarean section; Not immune to rubella; Gestational diabetes mellitus (GDM), antepartum; and Ingrown right big toenail on their problem list.  Patient reports no complaints.  Contractions: Not present. Vag. Bleeding: None.  Movement: Present. Denies leaking of fluid.   The following portions of the patient's history were reviewed and updated as appropriate: allergies, current medications, past family history, past medical history, past social history, past surgical history and problem list.   Objective:   Vitals:   04/29/22 1612  BP: 106/67  Pulse: 77  Weight: 171 lb (77.6 kg)    Fetal Status: Fetal Heart Rate (bpm): 132   Movement: Present  Presentation: Vertex  General:  Alert, oriented and cooperative. Patient is in no acute distress.  Skin: Skin is warm and dry. No rash noted.   Cardiovascular: Normal heart rate noted  Respiratory: Normal respiratory effort, no problems with respiration noted  Abdomen: Soft, gravid, appropriate for gestational age.  Pain/Pressure: Absent     Pelvic: Cervical exam deferred        Extremities: Normal range of motion.  Edema: Trace  Mental Status: Normal mood and affect. Normal behavior. Normal judgment and thought content.   Assessment and Plan:  Pregnancy: [id number] 384w4d. Diet controlled gestational diabetes mellitus (GDM), antepartum 2 hour post prandial dinner are about 25% in the 120s. Pt states she isn't as physically active after dinner. Continue expectant management but d/w her maybe starting PO meds if more elevated.  B/c of this  and her age, recommend 38wk IOL which she is amenable to. Patient set up for 3/21 AM IOL 2/26: ceph, afi 7.6, bpp 8/8 2/5: 56%, 2052g, ac 92%, afi 9.8, ceph  2. Supervision of high risk pregnancy, antepartum BTL if needs c/s .  GBS next visit  3. Language barrier In person interpreter used  4. Hx successful VBAC (vaginal birth after cesarean), currently pregnant TOLAC consent signed   5. Multigravida of advanced maternal age in third trimester See above  Preterm labor symptoms and general obstetric precautions including but not limited to vaginal bleeding, contractions, leaking of fluid and fetal movement were reviewed in detail with the patient. Please refer to After Visit Summary for other counseling recommendations.   No follow-ups on file.  Future Appointments  Date Time Provider De[place]3/06/2022  8:30 AM [facility]3/06/2022  8:45 AM [facility] US5 [facility]3/10/2022 10:15 AM NeCaren MacadamMD [facility]3/01/2023  8:15 AM [facility] NURSE [facility]3/01/2023  8:30 AM [facility] US2 [facility]Uh [facility]3/21/2024  7:15 AM MC-LD SCHED ROOM MC-INDC None    ChAletha HalimMD

## 2022-05-04 ENCOUNTER — Other Ambulatory Visit: Payer: Self-pay | Admitting: *Deleted

## 2022-05-04 ENCOUNTER — Ambulatory Visit: Payer: Self-pay | Attending: Obstetrics

## 2022-05-04 ENCOUNTER — Ambulatory Visit: Payer: Self-pay | Admitting: *Deleted

## 2022-05-04 VITALS — BP 116/64 | HR 67

## 2022-05-04 DIAGNOSIS — O0990 Supervision of high risk pregnancy, unspecified, unspecified trimester: Secondary | ICD-10-CM | POA: Diagnosis present

## 2022-05-04 DIAGNOSIS — O34219 Maternal care for unspecified type scar from previous cesarean delivery: Secondary | ICD-10-CM

## 2022-05-04 DIAGNOSIS — O09523 Supervision of elderly multigravida, third trimester: Secondary | ICD-10-CM

## 2022-05-04 DIAGNOSIS — O24419 Gestational diabetes mellitus in pregnancy, unspecified control: Secondary | ICD-10-CM | POA: Diagnosis present

## 2022-05-04 DIAGNOSIS — Z2839 Other underimmunization status: Secondary | ICD-10-CM

## 2022-05-04 DIAGNOSIS — O24410 Gestational diabetes mellitus in pregnancy, diet controlled: Secondary | ICD-10-CM

## 2022-05-04 DIAGNOSIS — Z789 Other specified health status: Secondary | ICD-10-CM | POA: Insufficient documentation

## 2022-05-04 DIAGNOSIS — Z3A36 36 weeks gestation of pregnancy: Secondary | ICD-10-CM

## 2022-05-08 ENCOUNTER — Ambulatory Visit (INDEPENDENT_AMBULATORY_CARE_PROVIDER_SITE_OTHER): Payer: Self-pay | Admitting: Family Medicine

## 2022-05-08 ENCOUNTER — Other Ambulatory Visit: Payer: Self-pay

## 2022-05-08 ENCOUNTER — Other Ambulatory Visit (HOSPITAL_COMMUNITY)
Admission: RE | Admit: 2022-05-08 | Discharge: 2022-05-08 | Disposition: A | Discharge status: Home / Self Care | Payer: Self-pay | Source: Ambulatory Visit | Attending: Family Medicine | Admitting: Family Medicine

## 2022-05-08 VITALS — BP 110/71 | HR 72 | Wt 171.0 lb

## 2022-05-08 DIAGNOSIS — O0993 Supervision of high risk pregnancy, unspecified, third trimester: Secondary | ICD-10-CM

## 2022-05-08 DIAGNOSIS — O24410 Gestational diabetes mellitus in pregnancy, diet controlled: Secondary | ICD-10-CM

## 2022-05-08 DIAGNOSIS — O0990 Supervision of high risk pregnancy, unspecified, unspecified trimester: Secondary | ICD-10-CM | POA: Insufficient documentation

## 2022-05-08 DIAGNOSIS — Z789 Other specified health status: Secondary | ICD-10-CM

## 2022-05-08 DIAGNOSIS — Z3A36 36 weeks gestation of pregnancy: Secondary | ICD-10-CM

## 2022-05-08 DIAGNOSIS — O09523 Supervision of elderly multigravida, third trimester: Secondary | ICD-10-CM

## 2022-05-08 NOTE — Progress Notes (Signed)
   PRENATAL VISIT NOTE  Subjective:  Annette [name] is a 42 y.o. [id number] at [redacted]w[redacted]d being seen today for ongoing prenatal care.  She is currently monitored for the following issues for this low-risk pregnancy and has Supervision of high risk pregnancy, antepartum; AMA (advanced maternal age) multigravida 13+; Language barrier; Hx successful VBAC (vaginal birth after cesarean), currently pregnant; Hx of cesarean section; Not immune to rubella; Gestational diabetes mellitus (GDM), antepartum; and Ingrown right big toenail on their problem list.  Patient reports no complaints.  Contractions: Not present. Vag. Bleeding: None.  Movement: Present. Denies leaking of fluid.   The following portions of the patient's history were reviewed and updated as appropriate: allergies, current medications, past family history, past medical history, past social history, past surgical history and problem list.   Objective:   Vitals:   05/08/22 1025  BP: 110/71  Pulse: 72  Weight: 171 lb (77.6 kg)    Fetal Status: Fetal Heart Rate (bpm): 122 Fundal Height: 37 cm Movement: Present  Presentation: Vertex  General:  Alert, oriented and cooperative. Patient is in no acute distress.  Skin: Skin is warm and dry. No rash noted.   Cardiovascular: Normal heart rate noted  Respiratory: Normal respiratory effort, no problems with respiration noted  Abdomen: Soft, gravid, appropriate for gestational age.  Pain/Pressure: Absent     Pelvic: Cervical exam deferred Dilation: Fingertip Effacement (%): Thick Station: Ballotable  Extremities: Normal range of motion.     Mental Status: Normal mood and affect. Normal behavior. Normal judgment and thought content.   Assessment and Plan:  Pregnancy: [id number] at [redacted]w[redacted]d 1. Supervision of high risk pregnancy, antepartum VBAC x2, signed consent  FH appropriate Reviewed plan of care and transition to weekly appts  2. Multigravida of advanced maternal age in third  trimester LR NIP  3. Language barrier Spanish  4. Diet controlled gestational diabetes mellitus (GDM), antepartum IOL is 3/21 AM BG are well controlled -- fastings 70-80s, pp 100-110 Growth US=2897g, 52nd%   Preterm labor symptoms and general obstetric precautions including but not limited to vaginal bleeding, contractions, leaking of fluid and fetal movement were reviewed in detail with the patient. Please refer to After Visit Summary for other counseling recommendations.   Return in about 1 week (around 05/15/2022) for Routine prenatal care.  Future Appointments  Date Time Provider [place]  05/11/2022  8:15 AM [facility] NURSE [facility]  05/11/2022  8:30 AM [facility] US2 [facility]  05/18/2022 11:15 AM [facility] NURSE [facility]  05/18/2022 11:30 AM [facility] US2 [facility]  05/21/2022  7:15 AM MC-LD SCHED ROOM MC-INDC None    Caren Macadam, MD

## 2022-05-11 ENCOUNTER — Ambulatory Visit: Payer: Medicaid Other | Attending: Obstetrics

## 2022-05-11 ENCOUNTER — Ambulatory Visit: Payer: Self-pay

## 2022-05-11 VITALS — BP 114/69 | HR 70

## 2022-05-11 DIAGNOSIS — O0990 Supervision of high risk pregnancy, unspecified, unspecified trimester: Secondary | ICD-10-CM | POA: Diagnosis present

## 2022-05-11 DIAGNOSIS — O09523 Supervision of elderly multigravida, third trimester: Secondary | ICD-10-CM

## 2022-05-11 DIAGNOSIS — O24410 Gestational diabetes mellitus in pregnancy, diet controlled: Secondary | ICD-10-CM | POA: Diagnosis not present

## 2022-05-11 DIAGNOSIS — Z789 Other specified health status: Secondary | ICD-10-CM | POA: Insufficient documentation

## 2022-05-11 DIAGNOSIS — O34219 Maternal care for unspecified type scar from previous cesarean delivery: Secondary | ICD-10-CM | POA: Diagnosis not present

## 2022-05-11 DIAGNOSIS — Z2839 Other underimmunization status: Secondary | ICD-10-CM

## 2022-05-11 DIAGNOSIS — O24419 Gestational diabetes mellitus in pregnancy, unspecified control: Secondary | ICD-10-CM | POA: Diagnosis present

## 2022-05-11 DIAGNOSIS — Z3A37 37 weeks gestation of pregnancy: Secondary | ICD-10-CM | POA: Diagnosis not present

## 2022-05-11 LAB — CERVICOVAGINAL ANCILLARY ONLY
Chlamydia: NEGATIVE
Comment: NEGATIVE
Comment: NORMAL
Neisseria Gonorrhea: NEGATIVE

## 2022-05-12 LAB — CULTURE, BETA STREP (GROUP B ONLY): Strep Gp B Culture: NEGATIVE

## 2022-05-14 ENCOUNTER — Encounter (HOSPITAL_COMMUNITY): Payer: Self-pay

## 2022-05-14 ENCOUNTER — Telehealth (HOSPITAL_COMMUNITY): Payer: Self-pay | Admitting: *Deleted

## 2022-05-14 NOTE — Telephone Encounter (Signed)
Preadmission screen interpreter number [phone]

## 2022-05-15 ENCOUNTER — Ambulatory Visit (INDEPENDENT_AMBULATORY_CARE_PROVIDER_SITE_OTHER): Payer: Self-pay | Admitting: Medical

## 2022-05-15 ENCOUNTER — Other Ambulatory Visit (HOSPITAL_COMMUNITY): Payer: Self-pay | Admitting: Advanced Practice Midwife

## 2022-05-15 ENCOUNTER — Other Ambulatory Visit: Payer: Self-pay

## 2022-05-15 ENCOUNTER — Encounter: Payer: Self-pay | Admitting: Medical

## 2022-05-15 ENCOUNTER — Telehealth (HOSPITAL_COMMUNITY): Payer: Self-pay | Admitting: *Deleted

## 2022-05-15 VITALS — BP 105/70 | HR 75 | Wt 173.6 lb

## 2022-05-15 DIAGNOSIS — O24410 Gestational diabetes mellitus in pregnancy, diet controlled: Secondary | ICD-10-CM

## 2022-05-15 DIAGNOSIS — O0990 Supervision of high risk pregnancy, unspecified, unspecified trimester: Secondary | ICD-10-CM

## 2022-05-15 DIAGNOSIS — O09523 Supervision of elderly multigravida, third trimester: Secondary | ICD-10-CM

## 2022-05-15 DIAGNOSIS — O34219 Maternal care for unspecified type scar from previous cesarean delivery: Secondary | ICD-10-CM

## 2022-05-15 DIAGNOSIS — Z3A37 37 weeks gestation of pregnancy: Secondary | ICD-10-CM

## 2022-05-15 DIAGNOSIS — Z98891 History of uterine scar from previous surgery: Secondary | ICD-10-CM

## 2022-05-15 DIAGNOSIS — O0993 Supervision of high risk pregnancy, unspecified, third trimester: Secondary | ICD-10-CM

## 2022-05-15 DIAGNOSIS — Z789 Other specified health status: Secondary | ICD-10-CM

## 2022-05-15 NOTE — Telephone Encounter (Signed)
Preadmission screen interpreter number [phone]

## 2022-05-15 NOTE — Progress Notes (Signed)
   PRENATAL VISIT NOTE  Subjective:  Annette [name] is a 42 y.o. [id number] at [redacted]w[redacted]d being seen today for ongoing prenatal care.  She is currently monitored for the following issues for this high-risk pregnancy and has Supervision of high risk pregnancy, antepartum; AMA (advanced maternal age) multigravida 64+; Language barrier; Hx successful VBAC (vaginal birth after cesarean), currently pregnant; Hx of cesarean section; Not immune to rubella; Gestational diabetes mellitus (GDM), antepartum; and Ingrown right big toenail on their problem list.  Patient reports no complaints.  Contractions: Not present. Vag. Bleeding: None.  Movement: Present. Denies leaking of fluid.   The following portions of the patient's history were reviewed and updated as appropriate: allergies, current medications, past family history, past medical history, past social history, past surgical history and problem list.   Objective:   Vitals:   05/15/22 1011  BP: 105/70  Pulse: 75  Weight: 173 lb 9.6 oz (78.7 kg)    Fetal Status: Fetal Heart Rate (bpm): 128 Fundal Height: 38 cm Movement: Present     General:  Alert, oriented and cooperative. Patient is in no acute distress.  Skin: Skin is warm and dry. No rash noted.   Cardiovascular: Normal heart rate noted  Respiratory: Normal respiratory effort, no problems with respiration noted  Abdomen: Soft, gravid, appropriate for gestational age.  Pain/Pressure: Absent     Pelvic: Cervical exam deferred        Extremities: Normal range of motion.  Edema: Trace  Mental Status: Normal mood and affect. Normal behavior. Normal judgment and thought content.   Assessment and Plan:  Pregnancy: [id number] at [redacted]w[redacted]d 1. Supervision of high risk pregnancy, antepartum - GBS negative - Considering POP for MOC  2. Hx of cesarean section  3. Language barrier - Interpreter present   4. Diet controlled gestational diabetes mellitus (GDM), antepartum - Good control noted on CBG  log today, only 2 values > 120 PP, all fasting values are normal  - Continue diet   5. Multigravida of advanced maternal age in third trimester - Weekly antenatal testing, scheduled next 3/18  6. Hx successful VBAC (vaginal birth after cesarean), currently pregnant - Planning VBAC again, consent signed previously   7. Not immune to rubella - Plan for PP MMR   8. [redacted] weeks gestation of pregnancy  Term labor symptoms and general obstetric precautions including but not limited to vaginal bleeding, contractions, leaking of fluid and fetal movement were reviewed in detail with the patient. Please refer to After Visit Summary for other counseling recommendations.   Return for PP visit, IOL scheduled 3/21.  Future Appointments  Date Time Provider [place]  05/18/2022 11:15 AM [facility] NURSE [facility]  05/18/2022 11:30 AM [facility] US2 [facility]  05/21/2022  7:15 AM MC-LD SCHED ROOM MC-INDC None    Kerry Hough, PA-C

## 2022-05-18 ENCOUNTER — Ambulatory Visit: Payer: Self-pay | Admitting: *Deleted

## 2022-05-18 ENCOUNTER — Ambulatory Visit: Payer: Self-pay | Attending: Obstetrics

## 2022-05-18 ENCOUNTER — Telehealth (HOSPITAL_COMMUNITY): Payer: Self-pay | Admitting: *Deleted

## 2022-05-18 VITALS — BP 121/61 | HR 71

## 2022-05-18 DIAGNOSIS — Z789 Other specified health status: Secondary | ICD-10-CM | POA: Insufficient documentation

## 2022-05-18 DIAGNOSIS — O0990 Supervision of high risk pregnancy, unspecified, unspecified trimester: Secondary | ICD-10-CM | POA: Diagnosis present

## 2022-05-18 DIAGNOSIS — O09523 Supervision of elderly multigravida, third trimester: Secondary | ICD-10-CM | POA: Diagnosis present

## 2022-05-18 DIAGNOSIS — O24419 Gestational diabetes mellitus in pregnancy, unspecified control: Secondary | ICD-10-CM | POA: Diagnosis present

## 2022-05-18 DIAGNOSIS — Z3A38 38 weeks gestation of pregnancy: Secondary | ICD-10-CM

## 2022-05-18 DIAGNOSIS — O34219 Maternal care for unspecified type scar from previous cesarean delivery: Secondary | ICD-10-CM

## 2022-05-18 DIAGNOSIS — O24410 Gestational diabetes mellitus in pregnancy, diet controlled: Secondary | ICD-10-CM

## 2022-05-18 DIAGNOSIS — Z2839 Other underimmunization status: Secondary | ICD-10-CM

## 2022-05-18 NOTE — Telephone Encounter (Signed)
Preadmission screen interpreter number [phone]

## 2022-05-19 ENCOUNTER — Other Ambulatory Visit: Payer: Self-pay | Admitting: Advanced Practice Midwife

## 2022-05-19 ENCOUNTER — Encounter (HOSPITAL_COMMUNITY): Payer: Self-pay | Admitting: *Deleted

## 2022-05-19 ENCOUNTER — Telehealth (HOSPITAL_COMMUNITY): Payer: Self-pay | Admitting: *Deleted

## 2022-05-19 NOTE — Telephone Encounter (Signed)
Preadmission screen interpreter number [phone]

## 2022-05-20 ENCOUNTER — Other Ambulatory Visit: Payer: Self-pay | Admitting: Advanced Practice Midwife

## 2022-05-21 ENCOUNTER — Other Ambulatory Visit: Payer: Self-pay

## 2022-05-21 ENCOUNTER — Inpatient Hospital Stay (HOSPITAL_COMMUNITY): Payer: Medicaid Other | Admitting: Anesthesiology

## 2022-05-21 ENCOUNTER — Encounter (HOSPITAL_COMMUNITY)
Admission: AD | Disposition: A | Discharge status: Home / Self Care | Payer: Self-pay | Source: Home / Self Care | Attending: Family Medicine

## 2022-05-21 ENCOUNTER — Encounter (HOSPITAL_COMMUNITY): Payer: Self-pay | Admitting: Family Medicine

## 2022-05-21 ENCOUNTER — Inpatient Hospital Stay (HOSPITAL_COMMUNITY): Admission: RE | Admit: 2022-05-21 | Payer: Self-pay | Source: Home / Self Care | Admitting: Obstetrics and Gynecology

## 2022-05-21 ENCOUNTER — Inpatient Hospital Stay (HOSPITAL_COMMUNITY)
Admission: AD | Admit: 2022-05-21 | Discharge: 2022-05-23 | DRG: 787 | Disposition: A | Discharge status: Home / Self Care | Payer: Medicaid Other | Attending: Family Medicine | Admitting: Family Medicine

## 2022-05-21 ENCOUNTER — Inpatient Hospital Stay (HOSPITAL_COMMUNITY): Payer: Self-pay

## 2022-05-21 DIAGNOSIS — O34219 Maternal care for unspecified type scar from previous cesarean delivery: Secondary | ICD-10-CM | POA: Diagnosis present

## 2022-05-21 DIAGNOSIS — O34211 Maternal care for low transverse scar from previous cesarean delivery: Secondary | ICD-10-CM | POA: Diagnosis present

## 2022-05-21 DIAGNOSIS — O24420 Gestational diabetes mellitus in childbirth, diet controlled: Secondary | ICD-10-CM

## 2022-05-21 DIAGNOSIS — O9912 Other diseases of the blood and blood-forming organs and certain disorders involving the immune mechanism complicating childbirth: Secondary | ICD-10-CM | POA: Diagnosis present

## 2022-05-21 DIAGNOSIS — O09523 Supervision of elderly multigravida, third trimester: Secondary | ICD-10-CM

## 2022-05-21 DIAGNOSIS — Z3A38 38 weeks gestation of pregnancy: Secondary | ICD-10-CM

## 2022-05-21 DIAGNOSIS — Z7982 Long term (current) use of aspirin: Secondary | ICD-10-CM | POA: Diagnosis not present

## 2022-05-21 DIAGNOSIS — O24429 Gestational diabetes mellitus in childbirth, unspecified control: Secondary | ICD-10-CM | POA: Diagnosis not present

## 2022-05-21 DIAGNOSIS — Z98891 History of uterine scar from previous surgery: Secondary | ICD-10-CM

## 2022-05-21 DIAGNOSIS — O0990 Supervision of high risk pregnancy, unspecified, unspecified trimester: Secondary | ICD-10-CM

## 2022-05-21 DIAGNOSIS — O26893 Other specified pregnancy related conditions, third trimester: Secondary | ICD-10-CM | POA: Diagnosis present

## 2022-05-21 DIAGNOSIS — Z23 Encounter for immunization: Secondary | ICD-10-CM | POA: Diagnosis not present

## 2022-05-21 DIAGNOSIS — Z789 Other specified health status: Secondary | ICD-10-CM | POA: Diagnosis present

## 2022-05-21 DIAGNOSIS — O24410 Gestational diabetes mellitus in pregnancy, diet controlled: Secondary | ICD-10-CM

## 2022-05-21 DIAGNOSIS — D72829 Elevated white blood cell count, unspecified: Secondary | ICD-10-CM | POA: Diagnosis present

## 2022-05-21 DIAGNOSIS — O4292 Full-term premature rupture of membranes, unspecified as to length of time between rupture and onset of labor: Principal | ICD-10-CM | POA: Diagnosis present

## 2022-05-21 DIAGNOSIS — O09529 Supervision of elderly multigravida, unspecified trimester: Secondary | ICD-10-CM

## 2022-05-21 DIAGNOSIS — O4202 Full-term premature rupture of membranes, onset of labor within 24 hours of rupture: Secondary | ICD-10-CM

## 2022-05-21 HISTORY — DX: Gestational diabetes mellitus in pregnancy, diet controlled: O24.410

## 2022-05-21 LAB — TYPE AND SCREEN
ABO/RH(D): A POS
Antibody Screen: NEGATIVE

## 2022-05-21 LAB — POCT FERN TEST: POCT Fern Test: POSITIVE

## 2022-05-21 LAB — GLUCOSE, CAPILLARY
Glucose-Capillary: 90 mg/dL (ref 70–99)
Glucose-Capillary: 109 mg/dL — ABNORMAL HIGH (ref 70–99)
Glucose-Capillary: 130 mg/dL — ABNORMAL HIGH (ref 70–99)
Glucose-Capillary: 123 mg/dL — ABNORMAL HIGH (ref 70–99)
Glucose-Capillary: 96 mg/dL (ref 70–99)

## 2022-05-21 LAB — CBC
HCT: 38.4 % (ref 36.0–46.0)
Hemoglobin: 13.2 g/dL (ref 12.0–15.0)
MCH: 31.1 pg (ref 26.0–34.0)
MCHC: 34.4 g/dL (ref 30.0–36.0)
MCV: 90.4 fL (ref 80.0–100.0)
Platelets: 187 10*3/uL (ref 150–400)
RBC: 4.25 MIL/uL (ref 3.87–5.11)
RDW: 14.6 % (ref 11.5–15.5)
WBC: 11.6 10*3/uL — ABNORMAL HIGH (ref 4.0–10.5)
nRBC: 0 % (ref 0.0–0.2)

## 2022-05-21 LAB — RPR: RPR Ser Ql: NONREACTIVE

## 2022-05-21 SURGERY — Surgical Case
Anesthesia: Epidural

## 2022-05-21 MED ORDER — COCONUT OIL OIL: 1.0000 | TOPICAL_OIL | Status: DC | PRN

## 2022-05-21 MED ORDER — TERBUTALINE SULFATE 1 MG/ML IJ SOLN: 0.2500 mg | Freq: Once | INTRAMUSCULAR | Status: DC | PRN

## 2022-05-21 MED ORDER — OXYTOCIN-SODIUM CHLORIDE 30-0.9 UT/500ML-% IV SOLN: 2.5000 [IU]/h | INTRAVENOUS | Status: AC

## 2022-05-21 MED ORDER — METHYLERGONOVINE MALEATE 0.2 MG/ML IJ SOLN
INTRAMUSCULAR | Status: DC | PRN
  Administered 2022-05-21: .2 mg via INTRAMUSCULAR

## 2022-05-21 MED ORDER — PRENATAL MULTIVITAMIN CH
1.0000 | ORAL_TABLET | Freq: Every day | ORAL | Status: DC
  Administered 2022-05-22 – 2022-05-23 (×2): 1 via ORAL
  Filled 2022-05-21 (×2): qty 1

## 2022-05-21 MED ORDER — KETOROLAC TROMETHAMINE 30 MG/ML IJ SOLN
INTRAMUSCULAR | Status: AC
  Filled 2022-05-21: qty 1

## 2022-05-21 MED ORDER — SODIUM CHLORIDE 0.9 % IR SOLN
Status: DC | PRN
  Administered 2022-05-21: 1

## 2022-05-21 MED ORDER — NALOXONE HCL 0.4 MG/ML IJ SOLN: 0.4000 mg | INTRAMUSCULAR | Status: DC | PRN

## 2022-05-21 MED ORDER — LACTATED RINGERS IV SOLN: 500.0000 mL | INTRAVENOUS | Status: DC | PRN

## 2022-05-21 MED ORDER — FENTANYL-BUPIVACAINE-NACL 0.5-0.125-0.9 MG/250ML-% EP SOLN
12.0000 mL/h | EPIDURAL | Status: DC | PRN
  Administered 2022-05-21: 12 mL/h via EPIDURAL
  Filled 2022-05-21: qty 250

## 2022-05-21 MED ORDER — EPHEDRINE 5 MG/ML INJ
10.0000 mg | INTRAVENOUS | Status: DC | PRN
  Filled 2022-05-21: qty 5

## 2022-05-21 MED ORDER — TERBUTALINE SULFATE 1 MG/ML IJ SOLN
0.2500 mg | Freq: Once | INTRAMUSCULAR | Status: AC | PRN
  Administered 2022-05-21: 0.25 mg via SUBCUTANEOUS

## 2022-05-21 MED ORDER — FENTANYL CITRATE (PF) 100 MCG/2ML IJ SOLN
50.0000 ug | INTRAMUSCULAR | Status: DC | PRN
  Administered 2022-05-21: 50 ug via INTRAVENOUS
  Filled 2022-05-21 (×2): qty 2

## 2022-05-21 MED ORDER — TRANEXAMIC ACID-NACL 1000-0.7 MG/100ML-% IV SOLN
1000.0000 mg | INTRAVENOUS | Status: AC
  Administered 2022-05-21: 1000 mg via INTRAVENOUS

## 2022-05-21 MED ORDER — WITCH HAZEL-GLYCERIN EX PADS: 1.0000 | MEDICATED_PAD | CUTANEOUS | Status: DC | PRN

## 2022-05-21 MED ORDER — LACTATED RINGERS AMNIOINFUSION: INTRAVENOUS | Status: DC

## 2022-05-21 MED ORDER — SODIUM CHLORIDE 0.9% FLUSH: 3.0000 mL | INTRAVENOUS | Status: DC | PRN

## 2022-05-21 MED ORDER — OXYCODONE HCL 5 MG PO TABS
5.0000 mg | ORAL_TABLET | ORAL | Status: DC | PRN
  Administered 2022-05-23: 5 mg via ORAL
  Filled 2022-05-21: qty 1

## 2022-05-21 MED ORDER — MEASLES, MUMPS & RUBELLA VAC IJ SOLR
0.5000 mL | Freq: Once | INTRAMUSCULAR | Status: AC
  Administered 2022-05-23: 0.5 mL via SUBCUTANEOUS
  Filled 2022-05-21: qty 0.5

## 2022-05-21 MED ORDER — HYDROMORPHONE HCL 1 MG/ML IJ SOLN
INTRAMUSCULAR | Status: AC
  Filled 2022-05-21: qty 0.5

## 2022-05-21 MED ORDER — DIPHENHYDRAMINE HCL 25 MG PO CAPS: 25.0000 mg | ORAL_CAPSULE | ORAL | Status: DC | PRN

## 2022-05-21 MED ORDER — MENTHOL 3 MG MT LOZG: 1.0000 | LOZENGE | OROMUCOSAL | Status: DC | PRN

## 2022-05-21 MED ORDER — TRANEXAMIC ACID-NACL 1000-0.7 MG/100ML-% IV SOLN: 1000.0000 mg | INTRAVENOUS | Status: DC

## 2022-05-21 MED ORDER — DIPHENHYDRAMINE HCL 50 MG/ML IJ SOLN: 12.5000 mg | INTRAMUSCULAR | Status: DC | PRN

## 2022-05-21 MED ORDER — MEDROXYPROGESTERONE ACETATE 150 MG/ML IM SUSP: 150.0000 mg | INTRAMUSCULAR | Status: DC | PRN

## 2022-05-21 MED ORDER — MAGNESIUM HYDROXIDE 400 MG/5ML PO SUSP: 30.0000 mL | ORAL | Status: DC | PRN

## 2022-05-21 MED ORDER — TETANUS-DIPHTH-ACELL PERTUSSIS 5-2.5-18.5 LF-MCG/0.5 IM SUSY: 0.5000 mL | PREFILLED_SYRINGE | Freq: Once | INTRAMUSCULAR | Status: DC

## 2022-05-21 MED ORDER — STERILE WATER FOR IRRIGATION IR SOLN
Status: DC | PRN
  Administered 2022-05-21: 1000 mL

## 2022-05-21 MED ORDER — HYDROMORPHONE HCL 1 MG/ML IJ SOLN
0.2500 mg | INTRAMUSCULAR | Status: DC | PRN
  Administered 2022-05-21: 0.5 mg via INTRAVENOUS

## 2022-05-21 MED ORDER — PHENYLEPHRINE 80 MCG/ML (10ML) SYRINGE FOR IV PUSH (FOR BLOOD PRESSURE SUPPORT)
80.0000 ug | PREFILLED_SYRINGE | INTRAVENOUS | Status: DC | PRN
  Administered 2022-05-21 (×2): 80 ug via INTRAVENOUS
  Filled 2022-05-21: qty 10

## 2022-05-21 MED ORDER — LIDOCAINE HCL (PF) 1 % IJ SOLN: 30.0000 mL | INTRAMUSCULAR | Status: DC | PRN

## 2022-05-21 MED ORDER — ENOXAPARIN SODIUM 40 MG/0.4ML IJ SOSY
40.0000 mg | PREFILLED_SYRINGE | INTRAMUSCULAR | Status: DC
  Administered 2022-05-22: 40 mg via SUBCUTANEOUS
  Filled 2022-05-21: qty 0.4

## 2022-05-21 MED ORDER — LACTATED RINGERS IV SOLN
500.0000 mL | Freq: Once | INTRAVENOUS | Status: AC
  Administered 2022-05-21: 500 mL via INTRAVENOUS

## 2022-05-21 MED ORDER — FENTANYL CITRATE (PF) 100 MCG/2ML IJ SOLN
100.0000 ug | INTRAMUSCULAR | Status: AC | PRN
  Administered 2022-05-21: 100 ug via INTRAVENOUS

## 2022-05-21 MED ORDER — SIMETHICONE 80 MG PO CHEW
80.0000 mg | CHEWABLE_TABLET | ORAL | Status: DC | PRN
  Administered 2022-05-23: 80 mg via ORAL
  Filled 2022-05-21: qty 1

## 2022-05-21 MED ORDER — OXYTOCIN-SODIUM CHLORIDE 30-0.9 UT/500ML-% IV SOLN
1.0000 m[IU]/min | INTRAVENOUS | Status: DC
  Administered 2022-05-21: 1 m[IU]/min via INTRAVENOUS

## 2022-05-21 MED ORDER — DEXAMETHASONE SODIUM PHOSPHATE 10 MG/ML IJ SOLN
INTRAMUSCULAR | Status: DC | PRN
  Administered 2022-05-21: 10 mg via INTRAVENOUS

## 2022-05-21 MED ORDER — SOD CITRATE-CITRIC ACID 500-334 MG/5ML PO SOLN: 30.0000 mL | ORAL | Status: DC | PRN

## 2022-05-21 MED ORDER — SODIUM CHLORIDE 0.9 % IV SOLN
500.0000 mg | INTRAVENOUS | Status: AC
  Administered 2022-05-21: 250 mg via INTRAVENOUS

## 2022-05-21 MED ORDER — IBUPROFEN 600 MG PO TABS
600.0000 mg | ORAL_TABLET | Freq: Four times a day (QID) | ORAL | Status: DC
  Administered 2022-05-22 – 2022-05-23 (×4): 600 mg via ORAL
  Filled 2022-05-21 (×4): qty 1

## 2022-05-21 MED ORDER — MEPERIDINE HCL 25 MG/ML IJ SOLN: 6.2500 mg | INTRAMUSCULAR | Status: DC | PRN

## 2022-05-21 MED ORDER — GABAPENTIN 300 MG PO CAPS
300.0000 mg | ORAL_CAPSULE | Freq: Two times a day (BID) | ORAL | Status: DC
  Administered 2022-05-21 – 2022-05-23 (×4): 300 mg via ORAL
  Filled 2022-05-21 (×4): qty 1

## 2022-05-21 MED ORDER — PROMETHAZINE HCL 25 MG/ML IJ SOLN: 6.2500 mg | INTRAMUSCULAR | Status: DC | PRN

## 2022-05-21 MED ORDER — ZOLPIDEM TARTRATE 5 MG PO TABS: 5.0000 mg | ORAL_TABLET | Freq: Every evening | ORAL | Status: DC | PRN

## 2022-05-21 MED ORDER — MORPHINE SULFATE (PF) 0.5 MG/ML IJ SOLN
INTRAMUSCULAR | Status: AC
  Filled 2022-05-21: qty 10

## 2022-05-21 MED ORDER — OXYTOCIN-SODIUM CHLORIDE 30-0.9 UT/500ML-% IV SOLN
2.5000 [IU]/h | INTRAVENOUS | Status: DC
  Administered 2022-05-21: 15 [IU]/h via INTRAVENOUS
  Filled 2022-05-21: qty 500

## 2022-05-21 MED ORDER — OXYTOCIN-SODIUM CHLORIDE 30-0.9 UT/500ML-% IV SOLN
INTRAVENOUS | Status: AC
  Filled 2022-05-21: qty 500

## 2022-05-21 MED ORDER — ACETAMINOPHEN 500 MG PO TABS
1000.0000 mg | ORAL_TABLET | Freq: Four times a day (QID) | ORAL | Status: DC
  Administered 2022-05-21 – 2022-05-23 (×7): 1000 mg via ORAL
  Filled 2022-05-21 (×7): qty 2

## 2022-05-21 MED ORDER — DIPHENHYDRAMINE HCL 25 MG PO CAPS: 25.0000 mg | ORAL_CAPSULE | Freq: Four times a day (QID) | ORAL | Status: DC | PRN

## 2022-05-21 MED ORDER — OXYCODONE-ACETAMINOPHEN 5-325 MG PO TABS: 2.0000 | ORAL_TABLET | ORAL | Status: DC | PRN

## 2022-05-21 MED ORDER — PHENYLEPHRINE 80 MCG/ML (10ML) SYRINGE FOR IV PUSH (FOR BLOOD PRESSURE SUPPORT): 80.0000 ug | PREFILLED_SYRINGE | INTRAVENOUS | Status: DC | PRN

## 2022-05-21 MED ORDER — SENNOSIDES-DOCUSATE SODIUM 8.6-50 MG PO TABS
2.0000 | ORAL_TABLET | Freq: Every day | ORAL | Status: DC
  Administered 2022-05-22 – 2022-05-23 (×2): 2 via ORAL
  Filled 2022-05-21 (×2): qty 2

## 2022-05-21 MED ORDER — FERROUS SULFATE 325 (65 FE) MG PO TABS
325.0000 mg | ORAL_TABLET | ORAL | Status: DC
  Administered 2022-05-22: 325 mg via ORAL
  Filled 2022-05-21: qty 1

## 2022-05-21 MED ORDER — CEFAZOLIN SODIUM-DEXTROSE 2-4 GM/100ML-% IV SOLN: 2.0000 g | INTRAVENOUS | Status: DC

## 2022-05-21 MED ORDER — HYDROMORPHONE HCL 1 MG/ML IJ SOLN: 1.0000 mg | INTRAMUSCULAR | Status: DC | PRN

## 2022-05-21 MED ORDER — MORPHINE SULFATE (PF) 0.5 MG/ML IJ SOLN
INTRAMUSCULAR | Status: DC | PRN
  Administered 2022-05-21: 3 mg via EPIDURAL

## 2022-05-21 MED ORDER — LACTATED RINGERS IV SOLN: INTRAVENOUS | Status: DC

## 2022-05-21 MED ORDER — DIPHENHYDRAMINE HCL 50 MG/ML IJ SOLN
12.5000 mg | INTRAMUSCULAR | Status: DC | PRN
  Administered 2022-05-21: 12.5 mg via INTRAVENOUS
  Filled 2022-05-21: qty 1

## 2022-05-21 MED ORDER — LIDOCAINE-EPINEPHRINE (PF) 2 %-1:200000 IJ SOLN
INTRAMUSCULAR | Status: DC | PRN
  Administered 2022-05-21: 3 mL via EPIDURAL
  Administered 2022-05-21 (×2): 5 mL via EPIDURAL

## 2022-05-21 MED ORDER — ACETAMINOPHEN 325 MG PO TABS: 650.0000 mg | ORAL_TABLET | ORAL | Status: DC | PRN

## 2022-05-21 MED ORDER — CEFAZOLIN SODIUM-DEXTROSE 2-3 GM-%(50ML) IV SOLR
INTRAVENOUS | Status: DC | PRN
  Administered 2022-05-21: 2 g via INTRAVENOUS

## 2022-05-21 MED ORDER — ONDANSETRON HCL 4 MG/2ML IJ SOLN
INTRAMUSCULAR | Status: DC | PRN
  Administered 2022-05-21: 4 mg via INTRAVENOUS

## 2022-05-21 MED ORDER — OXYCODONE HCL 5 MG/5ML PO SOLN: 5.0000 mg | Freq: Once | ORAL | Status: DC | PRN

## 2022-05-21 MED ORDER — KETOROLAC TROMETHAMINE 30 MG/ML IJ SOLN
30.0000 mg | Freq: Four times a day (QID) | INTRAMUSCULAR | Status: AC
  Administered 2022-05-22 (×2): 30 mg via INTRAVENOUS
  Filled 2022-05-21 (×2): qty 1

## 2022-05-21 MED ORDER — EPHEDRINE 5 MG/ML INJ
10.0000 mg | INTRAVENOUS | Status: DC | PRN
  Administered 2022-05-21: 10 mg via INTRAVENOUS

## 2022-05-21 MED ORDER — OXYTOCIN-SODIUM CHLORIDE 30-0.9 UT/500ML-% IV SOLN
1.0000 m[IU]/min | INTRAVENOUS | Status: DC
  Administered 2022-05-21: 2 m[IU]/min via INTRAVENOUS

## 2022-05-21 MED ORDER — OXYCODONE HCL 5 MG PO TABS: 5.0000 mg | ORAL_TABLET | Freq: Once | ORAL | Status: DC | PRN

## 2022-05-21 MED ORDER — SODIUM CHLORIDE 0.9 % IV SOLN
INTRAVENOUS | Status: AC
  Filled 2022-05-21: qty 5

## 2022-05-21 MED ORDER — NALOXONE HCL 4 MG/10ML IJ SOLN: 1.0000 ug/kg/h | INTRAVENOUS | Status: DC | PRN

## 2022-05-21 MED ORDER — OXYTOCIN BOLUS FROM INFUSION: 333.0000 mL | Freq: Once | INTRAVENOUS | Status: DC

## 2022-05-21 MED ORDER — KETOROLAC TROMETHAMINE 30 MG/ML IJ SOLN
30.0000 mg | Freq: Once | INTRAMUSCULAR | Status: AC | PRN
  Administered 2022-05-21: 30 mg via INTRAVENOUS

## 2022-05-21 MED ORDER — DIBUCAINE (PERIANAL) 1 % EX OINT: 1.0000 | TOPICAL_OINTMENT | CUTANEOUS | Status: DC | PRN

## 2022-05-21 MED ORDER — ONDANSETRON HCL 4 MG/2ML IJ SOLN: 4.0000 mg | Freq: Four times a day (QID) | INTRAMUSCULAR | Status: DC | PRN

## 2022-05-21 SURGICAL SUPPLY — 33 items
APL PRP STRL LF DISP 70% ISPRP (MISCELLANEOUS) ×2
CHLORAPREP W/TINT 26 (MISCELLANEOUS) ×2 IMPLANT
CLAMP UMBILICAL CORD (MISCELLANEOUS) ×1 IMPLANT
CLOTH BEACON ORANGE TIMEOUT ST (SAFETY) ×1 IMPLANT
DRSG OPSITE POSTOP 4X10 (GAUZE/BANDAGES/DRESSINGS) ×1 IMPLANT
ELECT REM PT RETURN 9FT ADLT (ELECTROSURGICAL) ×1
ELECTRODE REM PT RTRN 9FT ADLT (ELECTROSURGICAL) ×1 IMPLANT
EXTRACTOR VACUUM M CUP 4 TUBE (SUCTIONS) IMPLANT
GAUZE PAD ABD 8X10 STRL (GAUZE/BANDAGES/DRESSINGS) IMPLANT
GAUZE SPONGE 4X4 12PLY STRL LF (GAUZE/BANDAGES/DRESSINGS) IMPLANT
GLOVE BIOGEL PI IND STRL 7.0 (GLOVE) ×3 IMPLANT
GLOVE ECLIPSE 7.0 STRL STRAW (GLOVE) ×1 IMPLANT
GOWN STRL REUS W/TWL LRG LVL3 (GOWN DISPOSABLE) ×1 IMPLANT
GOWN STRL REUS W/TWL XL LVL3 (GOWN DISPOSABLE) ×1 IMPLANT
HEMOSTAT SURGICEL 2X14 (HEMOSTASIS) IMPLANT
KIT ABG SYR 3ML LUER SLIP (SYRINGE) IMPLANT
NDL HYPO 25X5/8 SAFETYGLIDE (NEEDLE) ×1 IMPLANT
NEEDLE HYPO 22GX1.5 SAFETY (NEEDLE) ×1 IMPLANT
NEEDLE HYPO 25X5/8 SAFETYGLIDE (NEEDLE) ×1 IMPLANT
NS IRRIG 1000ML POUR BTL (IV SOLUTION) ×1 IMPLANT
PACK C SECTION WH (CUSTOM PROCEDURE TRAY) ×1 IMPLANT
PAD ABD 7.5X8 STRL (GAUZE/BANDAGES/DRESSINGS) ×1 IMPLANT
PAD OB MATERNITY 4.3X12.25 (PERSONAL CARE ITEMS) ×1 IMPLANT
RTRCTR C-SECT PINK 25CM LRG (MISCELLANEOUS) IMPLANT
SUT PDS AB 0 CTX 36 PDP370T (SUTURE) IMPLANT
SUT PLAIN 2 0 XLH (SUTURE) IMPLANT
SUT VIC AB 0 CTX 36 (SUTURE) ×2
SUT VIC AB 0 CTX36XBRD ANBCTRL (SUTURE) ×2 IMPLANT
SUT VIC AB 4-0 KS 27 (SUTURE) ×1 IMPLANT
SYR CONTROL 10ML LL (SYRINGE) ×1 IMPLANT
TOWEL OR 17X24 6PK STRL BLUE (TOWEL DISPOSABLE) ×1 IMPLANT
TRAY FOLEY W/BAG SLVR 14FR LF (SET/KITS/TRAYS/PACK) ×1 IMPLANT
WATER STERILE IRR 1000ML POUR (IV SOLUTION) ×1 IMPLANT

## 2022-05-21 NOTE — Discharge Summary (Signed)
Postpartum Discharge Summary  Date of Service updated-3/23     Patient Name: Annette [name] DOB: 1980-06-01 MRN: [id number]  Date of admission: 05/21/2022 Delivery date:05/21/2022  Delivering provider: Verita Schneiders A  Date of discharge: 05/23/2022  Admitting diagnosis: GDM, class A1 [O24.410] Intrauterine pregnancy: [redacted]w[redacted]d     Secondary diagnosis:  Principal Problem:   GDM, class A1 Active Problems:   AMA (advanced maternal age) multigravida 41+   Language barrier   Hx successful VBAC (vaginal birth after cesarean), currently pregnant   Hx of cesarean section   Not immune to rubella  Additional problems: as above    Discharge diagnosis: Term Pregnancy Delivered and GDM A1                                              Post partum procedures: none Augmentation: Pitocin Complications: None  Hospital course: PROM With Cesarean Section   42 y.o. yo [id number] at [redacted]w[redacted]d was admitted to the hospital 05/21/2022 for PROM. Patient had a labor course significant for recurrent prolonged decels with up titrating pitocin. The patient went for cesarean section due to  fetal intolerance of labor . Delivery details are as follows: Membrane Rupture Time/Date: 11:30 PM ,05/20/2022   Delivery Method:C-Section, Low Transverse  Details of operation can be found in separate operative Note.  Patient had a postpartum course that was uncomplicated. She is ambulating, tolerating a regular diet, passing flatus, and urinating well.  Patient is discharged home in stable condition on 05/23/22.      Newborn Data: Birth date:05/21/2022  Birth time:7:51 PM  Gender:Female  Living status:Living  Apgars:9 ,9  Weight:2920 g                                Magnesium Sulfate received: No BMZ received: No Rhophylac:No [id number] postpartum T-DaP:Given prenatally Flu: N/A Transfusion:No  Physical exam  Vitals:   05/22/22 0945 05/22/22 1350 05/22/22 2305 05/23/22 0500  BP: 106/68 (!) 98/58 (!) 103/57  (!) 98/53  Pulse:   76 72  Resp: 16 14 17 17   Temp: 98 F (36.7 C) 98.2 F (36.8 C) 98.5 F (36.9 C) 98.4 F (36.9 C)  TempSrc:   Oral Oral  SpO2: 100% 98% 96% 96%  Weight:      Height:       General: alert, cooperative, and no distress Lochia: appropriate Uterine Fundus: firm Incision: Clean and dry with pressure dressing DVT Evaluation: No evidence of DVT seen on physical exam. Labs: Lab Results  Component Value Date   WBC 16.4 (H) 05/23/2022   HGB 9.6 (L) 05/23/2022   HCT 29.0 (L) 05/23/2022   MCV 95.4 05/23/2022   PLT 147 (L) 05/23/2022      Latest Ref Rng & Units 03/24/2009    1:38 AM  CMP  Glucose 70 - 99 mg/dL 100   BUN 6 - 23 mg/dL 6   Creatinine 0.4 - 1.2 mg/dL 0.51   Sodium 135 - 145 mEq/L 133   Potassium 3.5 - 5.1 mEq/L 3.5   Chloride 96 - 112 mEq/L 104   CO2 19 - 32 mEq/L 19   Calcium 8.4 - 10.5 mg/dL 7.8    Edinburgh Score:    05/22/2022    6:48 PM  Edinburgh Postnatal Depression Scale Screening Tool  I  have been able to laugh and see the funny side of things. 0  I have looked forward with enjoyment to things. 0  I have blamed myself unnecessarily when things went wrong. 0  I have been anxious or worried for no good reason. 0  I have felt scared or panicky for no good reason. 0  Things have been getting on top of me. 0  I have been so unhappy that I have had difficulty sleeping. 0  I have felt sad or miserable. 0  I have been so unhappy that I have been crying. 0  The thought of harming myself has occurred to me. 0  Edinburgh Postnatal Depression Scale Total 0     After visit meds:  Allergies as of 05/23/2022   No Known Allergies      Medication List     STOP taking these medications    aspirin EC 81 MG tablet       TAKE these medications    docusate sodium 100 MG capsule Commonly known as: Colace Take 1 capsule (100 mg total) by mouth 2 (two) times daily. What changed: You were already taking a medication with the same name,  and this prescription was added. Make sure you understand how and when to take each.   docusate sodium 100 MG capsule Commonly known as: Colace Take 1 capsule (100 mg total) by mouth 2 (two) times daily. What changed: Another medication with the same name was added. Make sure you understand how and when to take each.   ferrous sulfate 325 (65 FE) MG tablet Take 1 tablet (325 mg total) by mouth every other day. Start taking on: May 24, 2022   ibuprofen 600 MG tablet Commonly known as: ADVIL Take 1 tablet (600 mg total) by mouth every 6 (six) hours.   norethindrone 0.35 MG tablet Commonly known as: Ortho Micronor Take 1 tablet (0.35 mg total) by mouth daily.   oxyCODONE 5 MG immediate release tablet Commonly known as: Oxy IR/ROXICODONE Take 1 tablet (5 mg total) by mouth every 6 (six) hours as needed for up to 7 days for moderate pain, severe pain or breakthrough pain.   PRENATAL VITAMIN PO Take 1 tablet by mouth daily.         Discharge home in stable condition Infant Feeding: Breast Infant Disposition:home with mother Discharge instruction: per After Visit Summary and Postpartum booklet. Activity: Advance as tolerated. Pelvic rest for 6 weeks.  Diet: routine diet Future Appointments: Future Appointments  Date Time Provider [place]  05/28/2022  8:30 AM [facility] NURSE [facility]  07/03/2022  8:15 AM Aletha Halim, MD [facility]  07/03/2022  8:50 AM [facility]A LAB [facility] [place]   Follow up Visit:  [place] [facility] Follow up.   Specialty: Obstetrics and Gynecology Why: Follow up in one week for an incision check Contact information: [place] [phone]                Message sent to [facility] by Autry-Lott on 05/23/2022  Please schedule this patient for a In person postpartum visit in 6 weeks with the following provider: MD and  APP. Additional Postpartum F/U:2 hour GTT in 6 weeks and Incision check 1 week  High risk pregnancy complicated by:  AMA, hx of VBAC, hx of C/S, rubella NI Delivery mode:  C-Section, Low Transverse  Anticipated Birth Control:  POPs  05/23/2022 Annalee Genta, DO

## 2022-05-21 NOTE — Progress Notes (Signed)
Labor Progress Note Leonora Westbrooks is a 42 y.o. [id number] at [redacted]w[redacted]d presented for SROM and labor.  S:  Comfortable with epidural.  O:  BP 107/67   Pulse 67   Temp 98.6 F (37 C) (Oral)   Resp 18   Ht 4\' 9"  (1.448 m)   Wt 79.7 kg   LMP 08/23/2021 (Exact Date)   SpO2 94%   BMI 38.02 kg/m  EFM: baseline 125 bpm/ mod variability/ no accels/ late, prolonged decels  IUPC: q1.5-5/MVUs ~100 SVE: Dilation: 6 Effacement (%): 90 Cervical Position: Anterior Station: -1 Presentation: Vertex Exam by:: Julianne Handler, CNM ?ROT Pitocin: off  A/P: 42 y.o. [id number] [redacted]w[redacted]d  1. Labor: active, no progress from last check 2. FWB: Cat II 3. Pain: epidural 4. GDM: CBGs ordered and pending 5. TOLAC: previous VBAC x2  Pt repositioned several times, IVF bolus and Terb given then FHR improved. Dr. Ernestina Patches at bedside, discussed findings and plan with pt. Unclear MOD at this time.  Spanish interpreter present Julianne Handler, CNM 1:14 PM

## 2022-05-21 NOTE — Op Note (Signed)
[name] PROCEDURE DATE: 05/21/2022  PREOPERATIVE DIAGNOSES: Intrauterine pregnancy at [redacted]w[redacted]d weeks gestation; non-reassuring fetal status/fetal intolerance of labor; previous cesarean section; gestational diabetes; advanced maternal age  POSTOPERATIVE DIAGNOSES: The same  PROCEDURE: Repeat Low Transverse Cesarean Section  SURGEON:  Dr. Verita Schneiders  ASSISTANT:  Dr. Naaman Plummer Autry-Lott. An experienced assistant was required given the standard of surgical care given the complexity of the case.  This assistant was needed for exposure, dissection, suctioning, retraction, instrument exchange, assisting with delivery with administration of fundal pressure, and for overall help during the procedure.  ANESTHESIOLOGY TEAM: Anesthesiologist: Effie Berkshire, MD; Lynda Rainwater, MD CRNA: Sammie Bench, CRNA  INDICATIONS: Annette [name] is a 42 y.o. [phone] at [redacted]w[redacted]d here for cesarean section secondary to the indications listed under preoperative diagnoses; please see preoperative note for further details.  The risks of surgery were discussed with the patient including but were not limited to: bleeding which may require transfusion or reoperation; infection which may require antibiotics; injury to bowel, bladder, ureters or other surrounding organs; injury to the fetus; need for additional procedures including hysterectomy in the event of a life-threatening hemorrhage; formation of adhesions; placental abnormalities wth subsequent pregnancies; incisional problems; thromboembolic phenomenon and other postoperative/anesthesia complications.  The patient concurred with the proposed plan, giving informed written consent for the procedure.    FINDINGS:  Viable female infant in cephalic, direct occiput presentation. Nuchal cord x 1. Apgars pending at time of note but baby stable in OR.  Clear amniotic fluid.  Intact placenta, three vessel cord.  Normal uterus, fallopian tubes and ovaries  bilaterally. Very thin lower uterine segment, hysterotomy extended to right side laterally. Significant atony of uterus, ameliorated by IV pitocin and IM Methergine.  Greater than expected blood loss due to atony.  ANESTHESIA: Epidural  ESTIMATED BLOOD LOSS: 1200 ml SPECIMENS: Placenta sent to L&D COMPLICATIONS: None immediate  PROCEDURE IN DETAIL:  The patient preoperatively received intravenous antibiotics and had sequential compression devices applied to her lower extremities.  She was then taken to the operating room where the epidural anesthesia was dosed up to surgical level and was found to be adequate. She was then placed in a dorsal supine position with a leftward tilt, and prepped and draped in a sterile manner.  A foley catheter was placed into her bladder and attached to constant gravity.  After an adequate timeout was performed, a Pfannenstiel skin incision was made with scalpel on her preexisting scar and carried through to the underlying layer of fascia.  The fascia was incised in the midline, and this incision was extended bilaterally using the Mayo scissors.  Kocher clamps were applied to the superior aspect of the fascial incision and the underlying rectus muscles were dissected off bluntly and sharply.  A similar process was carried out on the inferior aspect of the fascial incision. The rectus muscles were separated in the midline and the peritoneum was entered bluntly. The Alexis self-retaining retractor was introduced into the abdominal cavity.  Attention was turned to the lower uterine segment where a low transverse hysterotomy was made with a scalpel and extended bilaterally bluntly.  The infant was successfully delivered, the cord was clamped and cut after one minute, and the infant was handed over to the awaiting neonatology team. Uterine massage was then administered, and the placenta delivered intact with a three-vessel cord. The uterus was then cleared of clots and debris.  The  hysterotomy and small right later extension was closed with 0 Vicryl in  a running locked fashion, and an imbricating layer was also placed with 0 Vicryl.  Figure-of-eight 0 Vicryl serosal stitches were placed to help with hemostasis. Surgicel was placed over the closed hysterotomy.  The pelvis was cleared of all clot and debris. Hemostasis was confirmed on all surfaces.  The retractor was removed.  The peritoneum was closed with a 0 Vicryl pursestring stitch. The fascia was then closed using 0 PDS in a running fashion.  The subcutaneous layer was irrigated, reapproximated with 2-0 plain gut interrupted stitches, and the skin was closed with a 4-0 Vicryl subcuticular stitch. The patient tolerated the procedure well. Sponge, instrument and needle counts were correct x 3.  She was taken to the recovery room in stable condition.    Verita Schneiders, MD, [place] for [employer], [place]

## 2022-05-21 NOTE — Anesthesia Procedure Notes (Addendum)
Epidural Patient location during procedure: OB Start time: 05/21/2022 9:17 AM End time: 05/21/2022 9:22 AM  Staffing Anesthesiologist: Effie Berkshire, MD Performed: anesthesiologist   Preanesthetic Checklist Completed: patient identified, IV checked, site marked, risks and benefits discussed, surgical consent, monitors and equipment checked, pre-op evaluation and timeout performed  Epidural Patient position: sitting Prep: DuraPrep Patient monitoring: heart rate, continuous pulse ox and blood pressure Approach: midline Location: L3-L4 Injection technique: LOR saline  Needle:  Needle type: Tuohy  Needle gauge: 17 G Needle length: 9 cm Catheter type: closed end flexible Catheter size: 20 Guage Test dose: negative and 1.5% lidocaine  Assessment Events: blood not aspirated, no cerebrospinal fluid, injection not painful, no injection resistance and no paresthesia  Additional Notes LOR @ 4  Patient identified. Risks/Benefits/Options discussed with patient including but not limited to bleeding, infection, nerve damage, paralysis, failed block, incomplete pain control, headache, blood pressure changes, nausea, vomiting, reactions to medications, itching and postpartum back pain. Confirmed with bedside nurse the patient's most recent platelet count. Confirmed with patient that they are not currently taking any anticoagulation, have any bleeding history or any family history of bleeding disorders. Patient expressed understanding and wished to proceed. All questions were answered. Sterile technique was used throughout the entire procedure. Please see nursing notes for vital signs. Test dose was given through epidural catheter and negative prior to continuing to dose epidural or start infusion. Warning signs of high block given to the patient including shortness of breath, tingling/numbness in hands, complete motor block, or any concerning symptoms with instructions to call for help. Patient was  given instructions on fall risk and not to get out of bed. All questions and concerns addressed with instructions to call with any issues or inadequate analgesia.    Reason for block:procedure for pain

## 2022-05-21 NOTE — Transfer of Care (Signed)
Immediate Anesthesia Transfer of Care Note  Patient: Annette [name]  Procedure(s) Performed: CESAREAN SECTION  Patient Location: PACU  Anesthesia Type:Epidural  Level of Consciousness: awake, alert , and oriented  Airway & Oxygen Therapy: Patient Spontanous Breathing  Post-op Assessment: Report given to RN and Post -op Vital signs reviewed and stable  Post vital signs: Reviewed and stable  Last Vitals:  Vitals Value Taken Time  BP 99/70 05/21/22 2049  Temp    Pulse 76 05/21/22 2053  Resp 18 05/21/22 2053  SpO2 97 % 05/21/22 2053  Vitals shown include unvalidated device data.  Last Pain:  Vitals:   05/21/22 1830  TempSrc: Oral  PainSc:          Complications: No notable events documented.

## 2022-05-21 NOTE — Lactation Note (Signed)
This note was copied from a baby's chart. Lactation Consultation Note  Patient Name: Annette [name] [id number] Date: 05/21/2022 Age:40 hours, P4, term female infant   Reason for consult: Initial assessment;Term;Breastfeeding assistance See Birth Parent- MR- AMA, hx C/S and GDMA1  Spanish Interpreter used " Effie Shy [id number]  Birth Parent feeding choice is breast and formula feeding. Per Birth Parent this is infant;s first time latching at the breast, Birth Parent latched infant on her left breast using football hold position, infant sustained latch and was still breastfeeding after 10 minutes when LC left the room. Birth Parent plans to latch infant 1st for every feeding and then supplement infant with formula afterwards, LC gave Birth Parent handout " Supplementing with Breastfeeding". Birth Parent will continue to BF infant according to hunger cues, on demand, 8 to 12+ times within 24 hours, STS. Birth Parent knows to call [place] for further latch assistance if needed. LC discussed importance of maternal rest, diet and hydration. Birth Parent was made aware of O/P services, breastfeeding support groups, community resources, and our phone # for post-discharge questions.    Maternal Data Does the patient have breastfeeding experience prior to this delivery?: Yes How long did the patient breastfeed?: Per Birth Parent, she BF 1st and 2nd child for 3 months each and 3rd child who is 30 years old for 11 months.  Feeding Mother's Current Feeding Choice: Breast Milk and Formula Nipple Type: Slow - flow  LATCH Score Latch: Grasps breast easily, tongue down, lips flanged, rhythmical sucking.  Audible Swallowing: Spontaneous and intermittent  Type of Nipple: Everted at rest and after stimulation  Comfort (Breast/Nipple): Soft / non-tender  Hold (Positioning): Assistance needed to correctly position infant at breast and maintain latch.  LATCH Score: 9   Lactation Tools Discussed/Used     Interventions Interventions: Breast feeding basics reviewed;Support pillows;Assisted with latch;Adjust position;Skin to skin;Position options;Education;Pace feeding;Breast compression;Breast massage  Discharge    Consult Status Consult Status: Follow-up Date: 05/22/22 Follow-up type: In-patient    Eulis Canner 05/21/2022, 11:41 PM

## 2022-05-21 NOTE — Progress Notes (Signed)
Labor Progress Note Annette [name] is a 42 y.o. [id number] at [redacted]w[redacted]d presented for SROM and labor  S:  Comfortable with epidural.  O:  BP 105/62   Pulse 79   Temp 98.5 F (36.9 C) (Oral)   Resp 18   Ht 4\' 9"  (1.448 m)   Wt 79.7 kg   LMP 08/23/2021 (Exact Date)   SpO2 94%   BMI 38.02 kg/m  EFM: baseline 125 bpm/ mod variability/ no accels/ variable, late decels  Toco/IUPC: 2-4 SVE: 6/90/-2   A/P: 42 y.o. [id number] [redacted]w[redacted]d  1. Labor: active, progressing well 2. FWB: Cat II 3. Pain: epidural 4. TOLAC  IUPC placed, amnioinfusion if variables not improved by maternal repositioning. Anticipate VBAC.  Spanish interpreter present Julianne Handler, CNM 9:48 AM

## 2022-05-21 NOTE — Progress Notes (Signed)
Patient ID: Annette [name], female   DOB: 1980/03/08, 42 y.o.   MRN: [id number]  Pitocin started @ 0515; breathing w ctx; received a dose of Fentanyl ~0600 and now requesting more  BP 138/79, 125/73, other VSS FHR 120s, +accels, early variables, avg LTV Ctx q 2-3 mins w Pit @ 16mu/min Cx last exam 1/30/vtx -3 @ 0607  IUP@38 .5wks GDMA1 TOLAC PROM>augmented w Pit  Keep ctx regular with Pitocin; will give a second dose of Fentanyl; anticipate vag delivery  Myrtis Ser [employer] 05/21/2022 7:49 AM

## 2022-05-21 NOTE — Anesthesia Preprocedure Evaluation (Addendum)
Anesthesia Evaluation  Patient identified by MRN, date of birth, ID band Patient awake    Reviewed: Allergy & Precautions, NPO status , Patient's Chart, lab work & pertinent test results  Airway Mallampati: II       Dental no notable dental hx.    Pulmonary    Pulmonary exam normal        Cardiovascular negative cardio ROS Normal cardiovascular exam     Neuro/Psych    GI/Hepatic   Endo/Other  diabetes    Renal/GU      Musculoskeletal   Abdominal   Peds  Hematology   Anesthesia Other Findings   Reproductive/Obstetrics (+) Pregnancy                             Anesthesia Physical Anesthesia Plan  ASA: 2 and emergent  Anesthesia Plan: Epidural   Post-op Pain Management:    Induction:   PONV Risk Score and Plan: 0  Airway Management Planned: Natural Airway  Additional Equipment: None  Intra-op Plan:   Post-operative Plan:   Informed Consent: I have reviewed the patients History and Physical, chart, labs and discussed the procedure including the risks, benefits and alternatives for the proposed anesthesia with the patient or authorized representative who has indicated his/her understanding and acceptance.       Plan Discussed with:   Anesthesia Plan Comments: (Lab Results      Component                Value               Date                      WBC                      11.6 (H)            05/21/2022                HGB                      13.2                05/21/2022                HCT                      38.4                05/21/2022                MCV                      90.4                05/21/2022                PLT                      187                 05/21/2022           )       Anesthesia Quick Evaluation

## 2022-05-21 NOTE — MAU Note (Signed)

## 2022-05-21 NOTE — Progress Notes (Signed)
Entire encounter performed in presence of a video Spanish interpreter  Called to the room by Dr. Harolyn Rutherford for continued decels with increasing pitocin. Cervical exam is unchanged per Julianne Handler, CNM and we are remote from delivery.   Indication for C/S: fetal intolerance  The risks of surgery were discussed with the patient including but were not limited to: bleeding which may require transfusion or reoperation; infection which may require antibiotics; injury to bowel, bladder, ureters or other surrounding organs; injury to the fetus; need for additional procedures including hysterectomy in the event of a life-threatening hemorrhage; formation of adhesions; placental abnormalities wth subsequent pregnancies; incisional problems; thromboembolic phenomenon and other postoperative/anesthesia complications.   The patient concurred with the proposed plan, giving informed written consent for the procedures.  Antibiotics ordered.  To OR when ready.   Gerlene Fee, DO OB Fellow, [place] for [place] 05/21/2022, 7:07 PM

## 2022-05-21 NOTE — H&P (Signed)
Annette [name] is a 42 y.o. [id number] female at [redacted]w[redacted]d by LMP c/w 19wk scan presenting for leaking fluid since 2330.   Reports active fetal movement, contractions: irreg, vaginal bleeding: scant staining, membranes: ruptured, clear fluid.  Initiated prenatal care at [facility] at 13.4 wks.   Most recent u/s : [redacted]w[redacted]d, EFW 52%, AFI [id number], cephalic, ant placenta.   This pregnancy complicated by: # GDMA1 # Prev C/S followed by VBAC x 2 # AMA # rubella nonimmune  Prenatal History/Complications:   # C/S at term 2006, term VBAC 2011 & 2017  Past Medical History: Past Medical History:  Diagnosis Date   Gestational diabetes    Pneumonia     Past Surgical History: Past Surgical History:  Procedure Laterality Date   CESAREAN SECTION  2006   LASIK  2013    Obstetrical History: OB History     Gravida  5   Para  3   Term  3   Preterm  0   AB  1   Living  3      SAB  1   IAB  0   Ectopic  0   Multiple  0   Live Births  3           Social History: Social History   Socioeconomic History   Marital status: Married    Spouse name: Not on file   Number of children: Not on file   Years of education: Not on file   Highest education level: Not on file  Occupational History   Not on file  Tobacco Use   Smoking status: Never   Smokeless tobacco: Never  Vaping Use   Vaping Use: Never used  Substance and Sexual Activity   Alcohol use: No   Drug use: No   Sexual activity: Yes    Birth control/protection: None  Other Topics Concern   Not on file  Social History Narrative   Not on file   Social Determinants of Health   Financial Resource Strain: Not on file  Food Insecurity: No Food Insecurity (11/05/2021)   Hunger Vital Sign    Worried About Running Out of Food in the Last Year: Never true    Ran Out of Food in the Last Year: Never true  Transportation Needs: No Transportation Needs (11/05/2021)   PRAPARE - Hydrologist  (Medical): No    Lack of Transportation (Non-Medical): No  Physical Activity: Not on file  Stress: Not on file  Social Connections: Not on file    Family History: Family History  Problem Relation Age of Onset   Cancer Neg Hx    Diabetes Neg Hx     Allergies: No Known Allergies  Medications Prior to Admission  Medication Sig Dispense Refill Last Dose   aspirin EC 81 MG tablet Take 1 tablet (81 mg total) by mouth daily. Swallow whole. 30 tablet 12 05/20/2022   Prenatal Vit-Fe Fumarate-FA (PRENATAL VITAMIN PO) Take 1 tablet by mouth daily.   05/20/2022   docusate sodium (COLACE) 100 MG capsule Take 1 capsule (100 mg total) by mouth 2 (two) times daily. (Patient not taking: Reported on 04/06/2022) 10 capsule 0     Review of Systems  Pertinent pos/neg as indicated in HPI  Blood pressure 110/70, pulse 71, temperature 98 F (36.7 C), temperature source Oral, resp. rate 20, height 4\' 9"  (1.448 m), weight 79.8 kg, last menstrual period 08/23/2021, SpO2 97 %, unknown if currently breastfeeding. General appearance:  alert, cooperative, and mild distress Lungs: clear to auscultation bilaterally Heart: regular rate and rhythm Abdomen: gravid, soft, non-tender, EFW by Leopold's approximately 7lbs Extremities: 1+ edema  Fetal monitoring: FHR: 120s bpm, variability: moderate,  Accelerations: Present,  decelerations:  Absent Uterine activity: irreg Dilation: 1 Effacement (%): 30 Exam by:: Youlanda Roys, RN. Presentation: cephalic   Prenatal labs: ABO, Rh: A/Positive/-- (09/27 1614) Antibody: Negative (09/27 1614) Rubella: <0.90 (09/27 1614) RPR: Non Reactive (01/08 0846)  HBsAg: Negative (09/27 1614)  HIV: Non Reactive (01/08 0846)  GBS: Negative/-- (03/08 1037)  2hr GTT: 93/220/232  Prenatal Transfer Tool  Maternal Diabetes: Yes:  Diabetes Type:  Diet controlled Genetic Screening: Normal Maternal Ultrasounds/Referrals: Normal Fetal Ultrasounds or other Referrals:  None Maternal  Substance Abuse:  No Significant Maternal Medications:  None Significant Maternal Lab Results: Group B Strep negative  Results for orders placed or performed during the hospital encounter of 05/21/22 (from the past 24 hour(s))  POCT fern test   Collection Time: 05/21/22  2:14 AM  Result Value Ref Range   POCT Fern Test Positive = ruptured amniotic membanes      Assessment:  [redacted]w[redacted]d SIUP  G5P3013  PROM  GDMA1  Cat 1 FHR  GBS Negative/-- (03/08 1037)  Plan:  Admit to L&D  IV pain meds/epidural prn active labor  Expectant management to start; can discuss induction prn  Anticipate vag delivery   Plans to breastfeed  Contraception: POPs  Circumcision: no  Myrtis Ser CNM 05/21/2022, 2:35 AM

## 2022-05-21 NOTE — Progress Notes (Addendum)
Entire encounter performed in presence of video interpreter  I went to the patient's room with Dr. Ernestina Patches after a 4 minute prolonged decel to the 37s. The pitocin was decreased to 1 from 3. The patient was placed on her right side and then in hands & knees. This position was uncomfortable so she was placed in exaggerated sims with good resolution of the FHR. The FSE was checked for good placement at this time as well. Discussed patient with Dr. Harolyn Rutherford, titrate up on pitocin and if prolonged decels continue consider alternate mode of delivery.   Gerlene Fee, DO OB Fellow, [place] for [place] 05/21/2022, 6:21 PM

## 2022-05-21 NOTE — MAU Note (Signed)
.  Annette [name] is a 42 y.o. at [redacted]w[redacted]d here in MAU reporting Water broke at 2330 pt reports pink/clear fluid.  +fm.     Onset of complaint:  Pain score: 4/10 lower abdominal pain contractions.  Vitals:   05/21/22 0157  BP: 121/66  Pulse: 73  Resp: 20  Temp: 98 F (36.7 C)    FHT:132 Lab orders placed from triage:  fern

## 2022-05-21 NOTE — Anesthesia Postprocedure Evaluation (Signed)
Anesthesia Post Note  Patient: Annette [name]  Procedure(s) Performed: CESAREAN SECTION     Patient location during evaluation: PACU Anesthesia Type: Epidural Level of consciousness: awake and alert Pain management: pain level controlled Vital Signs Assessment: post-procedure vital signs reviewed and stable Respiratory status: spontaneous breathing, nonlabored ventilation and respiratory function stable Cardiovascular status: blood pressure returned to baseline and stable Postop Assessment: no apparent nausea or vomiting Anesthetic complications: no   No notable events documented.  Last Vitals:  Vitals:   05/21/22 2145 05/21/22 2204  BP: 111/74 123/72  Pulse: 74 76  Resp: 19   Temp:  37.1 C  SpO2: 97% 96%    Last Pain:  Vitals:   05/21/22 2205  TempSrc:   PainSc: 0-No pain   Pain Goal:    LLE Motor Response: Purposeful movement (05/21/22 2145) LLE Sensation: Numbness (05/21/22 2145) RLE Motor Response: Purposeful movement (05/21/22 2145) RLE Sensation: Numbness, Tingling (05/21/22 2145)     Epidural/Spinal Function Cutaneous sensation: Able to Wiggle Toes (05/21/22 2145), Patient able to flex knees: Yes (05/21/22 2145), Patient able to lift hips off bed: Yes (05/21/22 2145), Back pain beyond tenderness at insertion site: No (05/21/22 2145), Progressively worsening motor and/or sensory loss: No (05/21/22 2145), Bowel and/or bladder incontinence post epidural: No (05/21/22 2145)  Lynda Rainwater

## 2022-05-22 ENCOUNTER — Encounter (HOSPITAL_COMMUNITY): Payer: Self-pay | Admitting: Obstetrics & Gynecology

## 2022-05-22 LAB — CBC
HCT: 34.9 % — ABNORMAL LOW (ref 36.0–46.0)
Hemoglobin: 12.2 g/dL (ref 12.0–15.0)
MCH: 31.5 pg (ref 26.0–34.0)
MCHC: 35 g/dL (ref 30.0–36.0)
MCV: 90.2 fL (ref 80.0–100.0)
Platelets: 170 10*3/uL (ref 150–400)
RBC: 3.87 MIL/uL (ref 3.87–5.11)
RDW: 15 % (ref 11.5–15.5)
WBC: 23.4 10*3/uL — ABNORMAL HIGH (ref 4.0–10.5)
nRBC: 0 % (ref 0.0–0.2)

## 2022-05-22 LAB — GLUCOSE, CAPILLARY: Glucose-Capillary: 108 mg/dL — ABNORMAL HIGH (ref 70–99)

## 2022-05-22 NOTE — Lactation Note (Deleted)
This note was copied from a baby's chart. Lactation Consultation Note  Patient Name: Boy Amba Limones [id number] Date: 05/22/2022 Age:40 hours Reason for consult: Initial assessment;Early term 37-38.6wks Experienced BF mom stated BF going well. Mom denies painful latch and baby BF well. Mom doesn't have any questions or concerns at this time. Encouraged mom to call for assistance or questions as needed. Newborn feeding habits reviewed.  Maternal Data Does the patient have breastfeeding experience prior to this delivery?: Yes How long did the patient breastfeed?: 27 months  Feeding    LATCH Score Latch:  ([place] didn't see latch. mom stated it was good.)  Audible Swallowing: None  Type of Nipple: Everted at rest and after stimulation  Comfort (Breast/Nipple): Soft / non-tender  Hold (Positioning): No assistance needed to correctly position infant at breast.  LATCH Score: 5   Lactation Tools Discussed/Used    Interventions Interventions: Breast feeding basics reviewed;LC Services brochure  Discharge    Consult Status Consult Status: PRN Date: 05/23/22 Follow-up type: In-patient    Theodoro Kalata 05/22/2022, 9:03 PM

## 2022-05-22 NOTE — Progress Notes (Signed)
POSTPARTUM PROGRESS NOTE  POD #1  Subjective:  Annette [name] is a 42 y.o. [id number] s/p LTCS for non-reassuring fetal status at [redacted]w[redacted]d.  She reports she doing well. No acute events overnight. She reports she is doing well, still having pain in her abdomen primarily around the c-section site. She has not passed flatus. Pain is moderately controlled.  Lochia is appropriate.  Objective: Blood pressure 108/62, pulse 74, temperature 98.6 F (37 C), temperature source Oral, resp. rate 16, height 4\' 9"  (1.448 m), weight 79.7 kg, last menstrual period 08/23/2021, SpO2 92 %, unknown if currently breastfeeding.  Physical Exam:  General: alert, cooperative and no distress Chest: no respiratory distress Heart:regular rate, distal pulses intact Abdomen: soft Uterine Fundus: firm, appropriately tender DVT Evaluation: No calf swelling or tenderness Extremities: trace edema Skin: warm, dry; honeycomb dressing clean dry and in place  Recent Labs    05/21/22 0233 05/22/22 0503  HGB 13.2 12.2  HCT 38.4 34.9*    Assessment/Plan: Annette [name] is a 42 y.o. [id number] s/p c-section at [redacted]w[redacted]d for non-reassuring fetal status/fetal intolerance of labor.  POD#1 - Doing welll; pain moderately controlled. H/H appropriate  Routine postpartum care  Encouraged OOB  Lovenox VTE prophylaxis  #Leukocytosis WBC 23. Afebrile. VSS. - repeat CBC 3/23  #C-section blood loss 1.2L EBL during procedure due to uterine atony, hemostatic at c-section close. Hemoglobin stable (12.2, down from 13.2). Follow CBC. - monitor for anemia symptoms  #A1GMD CBGs appropriate, diet controlled.  Contraception: POPs Feeding: breast  Dispo: Plan for discharge once ambulating, tolerating PO and pain well controlled on PO.   LOS: 1 day   Enid Baas, MD Family Medicine, PGY1 05/22/2022, 7:04 AM

## 2022-05-22 NOTE — Lactation Note (Signed)
This note was copied from a baby's chart. Lactation Consultation Note  Patient Name: Annette [name] [id number] Date: 05/22/2022 Age:40 hours Reason for consult: Follow-up assessment;Early term 37-38.6wks Used Spanish interpreter [place] Derry [phone]. Mom holding baby wrapped in 2 blankets and a hat. Mom is Breast/formula. Encouraged to give BF first before formula. Mom stated that baby is taking the breast very little. LC attempted to latch baby tot he breast baby not interested at this time. Baby had recently fed an hour prior LC coming. Asked mom if she is un-swaddling baby mom stated yes. Discussed feeding positions. Assisted in football hold, using pillows for support.  Discussed body alignment, stimulation w/nipple to top lip to get baby to open mouth for feeding. Noted slight edema to areola. Reverse pressure slightly helpful. Mom was able to latch baby but baby wasn't hungry at this time. Encourage mom to call for BF assistance if needed.  Maternal Data    Feeding    LATCH Score Latch: Too sleepy or reluctant, no latch achieved, no sucking elicited.  Audible Swallowing: None  Type of Nipple: Everted at rest and after stimulation  Comfort (Breast/Nipple): Soft / non-tender  Hold (Positioning): Assistance needed to correctly position infant at breast and maintain latch.  LATCH Score: 5   Lactation Tools Discussed/Used    Interventions Interventions: Breast feeding basics reviewed;Adjust position;Assisted with latch;Support pillows;Skin to skin;Position options;Breast massage;Hand express;Breast compression  Discharge    Consult Status Consult Status: Follow-up Date: 05/23/22 Follow-up type: In-patient    Theodoro Kalata 05/22/2022, 8:37 PM

## 2022-05-23 LAB — CBC WITH DIFFERENTIAL/PLATELET
Abs Immature Granulocytes: 0.16 10*3/uL — ABNORMAL HIGH (ref 0.00–0.07)
Basophils Absolute: 0 10*3/uL (ref 0.0–0.1)
Basophils Relative: 0 %
Eosinophils Absolute: 0.1 10*3/uL (ref 0.0–0.5)
Eosinophils Relative: 0 %
HCT: 29 % — ABNORMAL LOW (ref 36.0–46.0)
Hemoglobin: 9.6 g/dL — ABNORMAL LOW (ref 12.0–15.0)
Immature Granulocytes: 1 %
Lymphocytes Relative: 21 %
Lymphs Abs: 3.5 10*3/uL (ref 0.7–4.0)
MCH: 31.6 pg (ref 26.0–34.0)
MCHC: 33.1 g/dL (ref 30.0–36.0)
MCV: 95.4 fL (ref 80.0–100.0)
Monocytes Absolute: 1.2 10*3/uL — ABNORMAL HIGH (ref 0.1–1.0)
Monocytes Relative: 7 %
Neutro Abs: 11.5 10*3/uL — ABNORMAL HIGH (ref 1.7–7.7)
Neutrophils Relative %: 71 %
Platelets: 147 10*3/uL — ABNORMAL LOW (ref 150–400)
RBC: 3.04 MIL/uL — ABNORMAL LOW (ref 3.87–5.11)
RDW: 15.9 % — ABNORMAL HIGH (ref 11.5–15.5)
WBC: 16.4 10*3/uL — ABNORMAL HIGH (ref 4.0–10.5)
nRBC: 0 % (ref 0.0–0.2)

## 2022-05-23 LAB — GLUCOSE, CAPILLARY: Glucose-Capillary: 113 mg/dL — ABNORMAL HIGH (ref 70–99)

## 2022-05-23 MED ORDER — FERROUS SULFATE 325 (65 FE) MG PO TABS: 325.0000 mg | ORAL_TABLET | ORAL | 0 refills | Status: AC

## 2022-05-23 MED ORDER — DOCUSATE SODIUM 100 MG PO CAPS: 100.0000 mg | ORAL_CAPSULE | Freq: Two times a day (BID) | ORAL | 0 refills | Status: AC

## 2022-05-23 MED ORDER — NORETHINDRONE 0.35 MG PO TABS: 1.0000 | ORAL_TABLET | Freq: Every day | ORAL | 4 refills | Status: DC

## 2022-05-23 MED ORDER — OXYCODONE HCL 5 MG PO TABS: 5.0000 mg | ORAL_TABLET | Freq: Four times a day (QID) | ORAL | 0 refills | Status: AC | PRN

## 2022-05-23 MED ORDER — IBUPROFEN 600 MG PO TABS: 600.0000 mg | ORAL_TABLET | Freq: Four times a day (QID) | ORAL | 0 refills | Status: AC

## 2022-05-23 NOTE — Progress Notes (Signed)
Used language line, pt educated on safety and care for her and infant. Pt verbalized understanding and denied having questions. Pt leaving via wheelchair w/ spouse and infant safely in car seat.

## 2022-05-28 ENCOUNTER — Ambulatory Visit (INDEPENDENT_AMBULATORY_CARE_PROVIDER_SITE_OTHER): Payer: Self-pay | Admitting: *Deleted

## 2022-05-28 ENCOUNTER — Other Ambulatory Visit: Payer: Self-pay

## 2022-05-28 VITALS — BP 120/73 | HR 68 | Temp 98.6°F | Ht 59.0 in | Wt 153.9 lb

## 2022-05-28 DIAGNOSIS — Z0130 Encounter for examination of blood pressure without abnormal findings: Secondary | ICD-10-CM

## 2022-05-28 DIAGNOSIS — Z4889 Encounter for other specified surgical aftercare: Secondary | ICD-10-CM

## 2022-05-28 NOTE — Progress Notes (Signed)
Here for nurse visit for wound check and bp check s/p C/S 05/21/22 at 38.5wk for PROM,GDM.  Incision covered by honeycomb dressing. Dressing removed easily and shows scant dried dark bloody drainage. Incision intact without redness or drainage.  Reviewed wound care and when to call office. BP wnl. Reviewed postpartum appointment and 2 hr gtt appointment and to come fasting. She voices understanding. [name]

## 2022-07-02 ENCOUNTER — Other Ambulatory Visit: Payer: Self-pay

## 2022-07-02 DIAGNOSIS — O24429 Gestational diabetes mellitus in childbirth, unspecified control: Secondary | ICD-10-CM

## 2022-07-03 ENCOUNTER — Encounter: Payer: Self-pay | Admitting: Obstetrics and Gynecology

## 2022-07-03 ENCOUNTER — Other Ambulatory Visit: Payer: Self-pay

## 2022-07-03 ENCOUNTER — Ambulatory Visit (INDEPENDENT_AMBULATORY_CARE_PROVIDER_SITE_OTHER): Payer: Self-pay | Admitting: Obstetrics and Gynecology

## 2022-07-03 DIAGNOSIS — Z758 Other problems related to medical facilities and other health care: Secondary | ICD-10-CM

## 2022-07-03 DIAGNOSIS — Z98891 History of uterine scar from previous surgery: Secondary | ICD-10-CM

## 2022-07-03 DIAGNOSIS — O24429 Gestational diabetes mellitus in childbirth, unspecified control: Secondary | ICD-10-CM

## 2022-07-03 DIAGNOSIS — Z603 Acculturation difficulty: Secondary | ICD-10-CM

## 2022-07-03 MED ORDER — NORGESTIMATE-ETH ESTRADIOL 0.25-35 MG-MCG PO TABS: 1.0000 | ORAL_TABLET | Freq: Every day | ORAL | 3 refills | Status: DC

## 2022-07-03 NOTE — Progress Notes (Signed)
    Post Partum Visit Note  Stevy Romig is a 42 y.o. [id number] s/p RLTCS at 38wks for NRFHT at 38/5 weeks after presenting for SROM.  Pregnancy c/b GDMA1, AMA, h/o c-section.  Anesthesia: epidural. Postpartum course has been uncomplicated. Baby is doing well. Baby is feeding by bottle - Similac Advance. Periods: just had one and is finishing currently and only having spotting.. Bowel function is normal. Bladder function is normal. Patient is not sexually active. Contraception method is Micronor which she states she has already started. Postpartum depression screening: negative.  Pap and hpv neg 11/26/21  Review of Systems Pertinent items noted in HPI and remainder of comprehensive ROS otherwise negative.  Objective:  BP 115/79   Pulse 62   Ht 4\' 11"  (1.499 m)   Wt 146 lb 4.8 oz (66.4 kg)   LMP 06/29/2022 (Exact Date)   BMI 29.55 kg/m    General: NAD Abdomen: soft, nttp, nd, c/d/I incision  Assessment:   Normal postpartum exam.   Plan:  *PP: routine care. Patient does not smoke. She doesn't have insurance and I d/w her re: Sprintec OCPs vs staying on the Micronor and d/w her potential risk of VTE with switching but easier use. Pt would like to switch. Rx for Sprintec given. D/w her need for repeat pap in 2 years.  In person interpreter used  Henderson Bing, MD [facility] for [employer], [facility]

## 2022-07-04 ENCOUNTER — Encounter: Payer: Self-pay | Admitting: Obstetrics and Gynecology

## 2022-07-04 LAB — GLUCOSE TOLERANCE, 2 HOURS
Glucose, 2 hour: 118 mg/dL (ref 70–139)
Glucose, GTT - Fasting: 90 mg/dL (ref 70–99)

## 2022-07-06 ENCOUNTER — Telehealth: Payer: Self-pay

## 2022-07-06 NOTE — Telephone Encounter (Addendum)
-----   Message from [place] Bing, MD sent at 07/04/2022  9:25 AM EDT ----- All normal. Please let her know and that we recommend she get a hemoglobin A1c every year to check for diabetes outside of pregnancy. Thanks   Called pt with interpreter Debarah Crape; results given. Pt does not have PCP. Plans to follow up for care with health department; pt will ask if they can check A1C.

## 2023-06-10 ENCOUNTER — Other Ambulatory Visit: Payer: Self-pay | Admitting: Obstetrics and Gynecology

## 2023-06-28 ENCOUNTER — Telehealth: Payer: Self-pay | Admitting: Lactation Services

## 2023-06-28 MED ORDER — NORGESTIMATE-ETH ESTRADIOL 0.25-35 MG-MCG PO TABS: 1.0000 | ORAL_TABLET | Freq: Every day | ORAL | 0 refills | Status: DC

## 2023-06-28 MED ORDER — NORGESTIMATE-ETH ESTRADIOL 0.25-35 MG-MCG PO TABS: 1.0000 | ORAL_TABLET | Freq: Every day | ORAL | 0 refills | Status: AC

## 2023-06-28 NOTE — Telephone Encounter (Signed)
 Received refill request for Ortho Cyclen. Per chart review patient is due. Called patient with assistance of Priscilla Peguero Day, in house Research officer, trade union. Informed patient we can send in a 3 month supply and then she needs an annual exam scheduled.   Patient reports she has called the health department to schedule a follow up appointment, she was informed she needs to call back tomorrow.   She requests a 3 month supply sent in and will reach out for an appointment tomorrow at the Health Department.   OCP sent per standing orders.

## 2023-06-28 NOTE — Addendum Note (Signed)
 Addended by: Gracie Lav on: 06/28/2023 05:29 PM   Modules accepted: Orders
